# Patient Record
Sex: Female | Born: 1950 | Hispanic: Yes | Marital: Married | Smoking: Never smoker
Health system: Southern US, Community
[De-identification: ages and names within clinical notes are randomized; demographics above are authoritative.]

## PROBLEM LIST (undated history)

## (undated) DIAGNOSIS — K219 Gastro-esophageal reflux disease without esophagitis: Secondary | ICD-10-CM

## (undated) DIAGNOSIS — E785 Hyperlipidemia, unspecified: Secondary | ICD-10-CM

## (undated) DIAGNOSIS — Z8719 Personal history of other diseases of the digestive system: Secondary | ICD-10-CM

## (undated) DIAGNOSIS — K589 Irritable bowel syndrome without diarrhea: Secondary | ICD-10-CM

## (undated) DIAGNOSIS — C801 Malignant (primary) neoplasm, unspecified: Secondary | ICD-10-CM

## (undated) HISTORY — DX: Gastro-esophageal reflux disease without esophagitis: K21.9

## (undated) HISTORY — DX: Irritable bowel syndrome, unspecified: K58.9

## (undated) HISTORY — DX: Hyperlipidemia, unspecified: E78.5

## (undated) HISTORY — PX: COLONOSCOPY: SHX174

## (undated) HISTORY — PX: BREAST BIOPSY: SHX20

---

## 2015-01-04 ENCOUNTER — Other Ambulatory Visit: Payer: Self-pay | Admitting: Family Medicine

## 2015-01-04 DIAGNOSIS — Z1231 Encounter for screening mammogram for malignant neoplasm of breast: Secondary | ICD-10-CM

## 2015-01-12 ENCOUNTER — Ambulatory Visit
Admission: RE | Admit: 2015-01-12 | Discharge: 2015-01-12 | Disposition: A | Discharge status: Home / Self Care | Payer: 59 | Source: Ambulatory Visit | Attending: Family Medicine | Admitting: Family Medicine

## 2015-01-12 DIAGNOSIS — Z1231 Encounter for screening mammogram for malignant neoplasm of breast: Secondary | ICD-10-CM

## 2015-12-16 ENCOUNTER — Other Ambulatory Visit: Payer: Self-pay | Admitting: Family Medicine

## 2015-12-16 DIAGNOSIS — Z1231 Encounter for screening mammogram for malignant neoplasm of breast: Secondary | ICD-10-CM

## 2016-01-13 ENCOUNTER — Ambulatory Visit
Admission: RE | Admit: 2016-01-13 | Discharge: 2016-01-13 | Disposition: A | Discharge status: Home / Self Care | Payer: BLUE CROSS/BLUE SHIELD | Source: Ambulatory Visit | Attending: Family Medicine | Admitting: Family Medicine

## 2016-01-13 DIAGNOSIS — Z1231 Encounter for screening mammogram for malignant neoplasm of breast: Secondary | ICD-10-CM

## 2017-02-21 ENCOUNTER — Other Ambulatory Visit: Payer: Self-pay | Admitting: Physician Assistant

## 2017-02-21 DIAGNOSIS — Z1231 Encounter for screening mammogram for malignant neoplasm of breast: Secondary | ICD-10-CM

## 2017-03-12 ENCOUNTER — Encounter: Payer: Self-pay | Admitting: Radiology

## 2017-03-12 ENCOUNTER — Ambulatory Visit
Admission: RE | Admit: 2017-03-12 | Discharge: 2017-03-12 | Disposition: A | Discharge status: Home / Self Care | Payer: BLUE CROSS/BLUE SHIELD | Source: Ambulatory Visit | Attending: Physician Assistant | Admitting: Physician Assistant

## 2017-03-12 DIAGNOSIS — Z1231 Encounter for screening mammogram for malignant neoplasm of breast: Secondary | ICD-10-CM

## 2018-02-18 DIAGNOSIS — R1033 Periumbilical pain: Secondary | ICD-10-CM | POA: Diagnosis not present

## 2018-02-18 DIAGNOSIS — Z136 Encounter for screening for cardiovascular disorders: Secondary | ICD-10-CM | POA: Diagnosis not present

## 2018-02-18 DIAGNOSIS — Z0000 Encounter for general adult medical examination without abnormal findings: Secondary | ICD-10-CM | POA: Diagnosis not present

## 2019-08-26 ENCOUNTER — Other Ambulatory Visit: Payer: Self-pay | Admitting: Physician Assistant

## 2019-08-26 DIAGNOSIS — Z1231 Encounter for screening mammogram for malignant neoplasm of breast: Secondary | ICD-10-CM

## 2019-09-01 DIAGNOSIS — Z0000 Encounter for general adult medical examination without abnormal findings: Secondary | ICD-10-CM | POA: Diagnosis not present

## 2019-09-01 DIAGNOSIS — Z131 Encounter for screening for diabetes mellitus: Secondary | ICD-10-CM | POA: Diagnosis not present

## 2019-09-01 DIAGNOSIS — Z136 Encounter for screening for cardiovascular disorders: Secondary | ICD-10-CM | POA: Diagnosis not present

## 2019-09-03 ENCOUNTER — Other Ambulatory Visit: Payer: Self-pay

## 2019-09-03 ENCOUNTER — Ambulatory Visit
Admission: RE | Admit: 2019-09-03 | Discharge: 2019-09-03 | Disposition: A | Discharge status: Home / Self Care | Payer: Medicare HMO | Source: Ambulatory Visit | Attending: Physician Assistant | Admitting: Physician Assistant

## 2019-09-03 DIAGNOSIS — Z1231 Encounter for screening mammogram for malignant neoplasm of breast: Secondary | ICD-10-CM

## 2019-09-04 ENCOUNTER — Other Ambulatory Visit: Payer: Self-pay | Admitting: Physician Assistant

## 2019-09-04 DIAGNOSIS — N63 Unspecified lump in unspecified breast: Secondary | ICD-10-CM

## 2019-09-04 DIAGNOSIS — N644 Mastodynia: Secondary | ICD-10-CM

## 2019-09-10 ENCOUNTER — Ambulatory Visit: Payer: Medicare HMO

## 2019-09-10 ENCOUNTER — Ambulatory Visit
Admission: RE | Admit: 2019-09-10 | Discharge: 2019-09-10 | Disposition: A | Discharge status: Home / Self Care | Payer: Medicare HMO | Source: Ambulatory Visit | Attending: Physician Assistant | Admitting: Physician Assistant

## 2019-09-10 ENCOUNTER — Other Ambulatory Visit: Payer: Self-pay

## 2019-09-10 ENCOUNTER — Other Ambulatory Visit: Payer: Self-pay | Admitting: Physician Assistant

## 2019-09-10 DIAGNOSIS — N644 Mastodynia: Secondary | ICD-10-CM

## 2019-09-10 DIAGNOSIS — N63 Unspecified lump in unspecified breast: Secondary | ICD-10-CM

## 2019-09-10 DIAGNOSIS — R928 Other abnormal and inconclusive findings on diagnostic imaging of breast: Secondary | ICD-10-CM | POA: Diagnosis not present

## 2019-09-23 DIAGNOSIS — Z0100 Encounter for examination of eyes and vision without abnormal findings: Secondary | ICD-10-CM | POA: Diagnosis not present

## 2019-09-23 DIAGNOSIS — H2513 Age-related nuclear cataract, bilateral: Secondary | ICD-10-CM | POA: Diagnosis not present

## 2019-09-23 DIAGNOSIS — D3132 Benign neoplasm of left choroid: Secondary | ICD-10-CM | POA: Diagnosis not present

## 2019-09-24 DIAGNOSIS — Z833 Family history of diabetes mellitus: Secondary | ICD-10-CM | POA: Diagnosis not present

## 2019-09-24 DIAGNOSIS — R030 Elevated blood-pressure reading, without diagnosis of hypertension: Secondary | ICD-10-CM | POA: Diagnosis not present

## 2019-09-24 DIAGNOSIS — Z008 Encounter for other general examination: Secondary | ICD-10-CM | POA: Diagnosis not present

## 2019-09-24 DIAGNOSIS — E663 Overweight: Secondary | ICD-10-CM | POA: Diagnosis not present

## 2019-09-24 DIAGNOSIS — Z6829 Body mass index (BMI) 29.0-29.9, adult: Secondary | ICD-10-CM | POA: Diagnosis not present

## 2020-02-29 DIAGNOSIS — R69 Illness, unspecified: Secondary | ICD-10-CM | POA: Diagnosis not present

## 2020-04-21 DIAGNOSIS — R69 Illness, unspecified: Secondary | ICD-10-CM | POA: Diagnosis not present

## 2020-05-12 DIAGNOSIS — D1801 Hemangioma of skin and subcutaneous tissue: Secondary | ICD-10-CM | POA: Diagnosis not present

## 2020-05-12 DIAGNOSIS — B354 Tinea corporis: Secondary | ICD-10-CM | POA: Diagnosis not present

## 2020-06-16 DIAGNOSIS — J069 Acute upper respiratory infection, unspecified: Secondary | ICD-10-CM | POA: Diagnosis not present

## 2020-06-17 DIAGNOSIS — J069 Acute upper respiratory infection, unspecified: Secondary | ICD-10-CM | POA: Diagnosis not present

## 2020-07-19 DIAGNOSIS — U071 COVID-19: Secondary | ICD-10-CM | POA: Diagnosis not present

## 2020-07-19 DIAGNOSIS — J40 Bronchitis, not specified as acute or chronic: Secondary | ICD-10-CM | POA: Diagnosis not present

## 2020-09-01 DIAGNOSIS — R109 Unspecified abdominal pain: Secondary | ICD-10-CM | POA: Diagnosis not present

## 2020-09-01 DIAGNOSIS — Z136 Encounter for screening for cardiovascular disorders: Secondary | ICD-10-CM | POA: Diagnosis not present

## 2020-09-01 DIAGNOSIS — Z0000 Encounter for general adult medical examination without abnormal findings: Secondary | ICD-10-CM | POA: Diagnosis not present

## 2020-09-01 DIAGNOSIS — Z131 Encounter for screening for diabetes mellitus: Secondary | ICD-10-CM | POA: Diagnosis not present

## 2020-09-08 ENCOUNTER — Other Ambulatory Visit: Payer: Self-pay | Admitting: Nurse Practitioner

## 2020-09-08 DIAGNOSIS — Z1231 Encounter for screening mammogram for malignant neoplasm of breast: Secondary | ICD-10-CM

## 2020-09-16 DIAGNOSIS — R1013 Epigastric pain: Secondary | ICD-10-CM | POA: Diagnosis not present

## 2020-09-16 DIAGNOSIS — Z1211 Encounter for screening for malignant neoplasm of colon: Secondary | ICD-10-CM | POA: Diagnosis not present

## 2020-09-19 DIAGNOSIS — L2084 Intrinsic (allergic) eczema: Secondary | ICD-10-CM | POA: Diagnosis not present

## 2020-10-27 ENCOUNTER — Other Ambulatory Visit: Payer: Self-pay

## 2020-10-27 ENCOUNTER — Ambulatory Visit
Admission: RE | Admit: 2020-10-27 | Discharge: 2020-10-27 | Disposition: A | Discharge status: Home / Self Care | Payer: Medicare HMO | Source: Ambulatory Visit | Attending: Nurse Practitioner | Admitting: Nurse Practitioner

## 2020-10-27 DIAGNOSIS — Z1231 Encounter for screening mammogram for malignant neoplasm of breast: Secondary | ICD-10-CM

## 2020-11-17 DIAGNOSIS — R1013 Epigastric pain: Secondary | ICD-10-CM | POA: Diagnosis not present

## 2020-11-17 DIAGNOSIS — K219 Gastro-esophageal reflux disease without esophagitis: Secondary | ICD-10-CM | POA: Diagnosis not present

## 2020-11-24 DIAGNOSIS — R1013 Epigastric pain: Secondary | ICD-10-CM | POA: Diagnosis not present

## 2021-02-10 DIAGNOSIS — L601 Onycholysis: Secondary | ICD-10-CM | POA: Diagnosis not present

## 2021-03-08 DIAGNOSIS — R030 Elevated blood-pressure reading, without diagnosis of hypertension: Secondary | ICD-10-CM | POA: Diagnosis not present

## 2021-03-08 DIAGNOSIS — Z823 Family history of stroke: Secondary | ICD-10-CM | POA: Diagnosis not present

## 2021-03-08 DIAGNOSIS — K219 Gastro-esophageal reflux disease without esophagitis: Secondary | ICD-10-CM | POA: Diagnosis not present

## 2021-03-08 DIAGNOSIS — M1990 Unspecified osteoarthritis, unspecified site: Secondary | ICD-10-CM | POA: Diagnosis not present

## 2021-03-08 DIAGNOSIS — Z6827 Body mass index (BMI) 27.0-27.9, adult: Secondary | ICD-10-CM | POA: Diagnosis not present

## 2021-03-08 DIAGNOSIS — E663 Overweight: Secondary | ICD-10-CM | POA: Diagnosis not present

## 2021-03-08 DIAGNOSIS — R32 Unspecified urinary incontinence: Secondary | ICD-10-CM | POA: Diagnosis not present

## 2021-03-08 DIAGNOSIS — Z833 Family history of diabetes mellitus: Secondary | ICD-10-CM | POA: Diagnosis not present

## 2021-06-14 DIAGNOSIS — J060 Acute laryngopharyngitis: Secondary | ICD-10-CM | POA: Diagnosis not present

## 2021-08-07 DIAGNOSIS — A048 Other specified bacterial intestinal infections: Secondary | ICD-10-CM | POA: Diagnosis not present

## 2021-08-07 DIAGNOSIS — R109 Unspecified abdominal pain: Secondary | ICD-10-CM | POA: Diagnosis not present

## 2021-08-21 ENCOUNTER — Other Ambulatory Visit: Payer: Self-pay | Admitting: Gastroenterology

## 2021-08-21 DIAGNOSIS — R109 Unspecified abdominal pain: Secondary | ICD-10-CM

## 2021-08-23 ENCOUNTER — Ambulatory Visit
Admission: RE | Admit: 2021-08-23 | Discharge: 2021-08-23 | Disposition: A | Discharge status: Home / Self Care | Payer: Medicare HMO | Source: Ambulatory Visit | Attending: Gastroenterology | Admitting: Gastroenterology

## 2021-08-23 DIAGNOSIS — R109 Unspecified abdominal pain: Secondary | ICD-10-CM

## 2021-08-23 DIAGNOSIS — A048 Other specified bacterial intestinal infections: Secondary | ICD-10-CM | POA: Diagnosis not present

## 2021-08-23 DIAGNOSIS — K8020 Calculus of gallbladder without cholecystitis without obstruction: Secondary | ICD-10-CM | POA: Diagnosis not present

## 2021-08-23 DIAGNOSIS — K760 Fatty (change of) liver, not elsewhere classified: Secondary | ICD-10-CM | POA: Diagnosis not present

## 2021-09-19 ENCOUNTER — Other Ambulatory Visit: Payer: Self-pay | Admitting: Nurse Practitioner

## 2021-09-19 DIAGNOSIS — Z1231 Encounter for screening mammogram for malignant neoplasm of breast: Secondary | ICD-10-CM

## 2021-09-20 DIAGNOSIS — K589 Irritable bowel syndrome without diarrhea: Secondary | ICD-10-CM | POA: Diagnosis not present

## 2021-09-20 DIAGNOSIS — Z13820 Encounter for screening for osteoporosis: Secondary | ICD-10-CM | POA: Diagnosis not present

## 2021-09-20 DIAGNOSIS — E785 Hyperlipidemia, unspecified: Secondary | ICD-10-CM | POA: Diagnosis not present

## 2021-09-20 DIAGNOSIS — Z7185 Encounter for immunization safety counseling: Secondary | ICD-10-CM | POA: Diagnosis not present

## 2021-09-20 DIAGNOSIS — Z0000 Encounter for general adult medical examination without abnormal findings: Secondary | ICD-10-CM | POA: Diagnosis not present

## 2021-09-20 DIAGNOSIS — Z1211 Encounter for screening for malignant neoplasm of colon: Secondary | ICD-10-CM | POA: Diagnosis not present

## 2021-09-20 DIAGNOSIS — K8020 Calculus of gallbladder without cholecystitis without obstruction: Secondary | ICD-10-CM | POA: Diagnosis not present

## 2021-09-22 ENCOUNTER — Other Ambulatory Visit: Payer: Self-pay | Admitting: Family Medicine

## 2021-09-22 DIAGNOSIS — Z13820 Encounter for screening for osteoporosis: Secondary | ICD-10-CM

## 2021-10-19 DIAGNOSIS — K219 Gastro-esophageal reflux disease without esophagitis: Secondary | ICD-10-CM | POA: Diagnosis not present

## 2021-10-19 DIAGNOSIS — K589 Irritable bowel syndrome without diarrhea: Secondary | ICD-10-CM | POA: Diagnosis not present

## 2021-10-19 DIAGNOSIS — Z833 Family history of diabetes mellitus: Secondary | ICD-10-CM | POA: Diagnosis not present

## 2021-10-30 ENCOUNTER — Ambulatory Visit
Admission: RE | Admit: 2021-10-30 | Discharge: 2021-10-30 | Disposition: A | Discharge status: Home / Self Care | Payer: Medicare HMO | Source: Ambulatory Visit | Attending: Nurse Practitioner | Admitting: Nurse Practitioner

## 2021-10-30 DIAGNOSIS — Z1231 Encounter for screening mammogram for malignant neoplasm of breast: Secondary | ICD-10-CM

## 2021-11-02 ENCOUNTER — Other Ambulatory Visit: Payer: Self-pay | Admitting: Nurse Practitioner

## 2021-11-02 DIAGNOSIS — N6452 Nipple discharge: Secondary | ICD-10-CM

## 2021-12-21 ENCOUNTER — Other Ambulatory Visit: Payer: Self-pay | Admitting: Nurse Practitioner

## 2021-12-21 ENCOUNTER — Ambulatory Visit
Admission: RE | Admit: 2021-12-21 | Discharge: 2021-12-21 | Disposition: A | Discharge status: Home / Self Care | Payer: Medicare HMO | Source: Ambulatory Visit | Attending: Nurse Practitioner | Admitting: Nurse Practitioner

## 2021-12-21 DIAGNOSIS — R921 Mammographic calcification found on diagnostic imaging of breast: Secondary | ICD-10-CM | POA: Diagnosis not present

## 2021-12-21 DIAGNOSIS — N6452 Nipple discharge: Secondary | ICD-10-CM

## 2021-12-28 ENCOUNTER — Ambulatory Visit
Admission: RE | Admit: 2021-12-28 | Discharge: 2021-12-28 | Disposition: A | Discharge status: Home / Self Care | Payer: Medicare HMO | Source: Ambulatory Visit | Attending: Nurse Practitioner | Admitting: Nurse Practitioner

## 2021-12-28 ENCOUNTER — Other Ambulatory Visit: Payer: Self-pay | Admitting: Family Medicine

## 2021-12-28 DIAGNOSIS — R921 Mammographic calcification found on diagnostic imaging of breast: Secondary | ICD-10-CM

## 2021-12-28 DIAGNOSIS — D241 Benign neoplasm of right breast: Secondary | ICD-10-CM | POA: Diagnosis not present

## 2022-01-16 DIAGNOSIS — N6452 Nipple discharge: Secondary | ICD-10-CM | POA: Diagnosis not present

## 2022-01-30 ENCOUNTER — Other Ambulatory Visit: Payer: Self-pay | Admitting: Surgery

## 2022-01-30 DIAGNOSIS — N6452 Nipple discharge: Secondary | ICD-10-CM

## 2022-02-14 ENCOUNTER — Ambulatory Visit
Admission: RE | Admit: 2022-02-14 | Discharge: 2022-02-14 | Disposition: A | Discharge status: Home / Self Care | Payer: Medicare HMO | Source: Ambulatory Visit | Attending: Surgery | Admitting: Surgery

## 2022-02-14 DIAGNOSIS — N6452 Nipple discharge: Secondary | ICD-10-CM

## 2022-02-14 DIAGNOSIS — N6489 Other specified disorders of breast: Secondary | ICD-10-CM | POA: Diagnosis not present

## 2022-02-14 MED ORDER — GADOBUTROL 1 MMOL/ML IV SOLN
7.0000 mL | Freq: Once | INTRAVENOUS | Status: AC | PRN
  Administered 2022-02-14: 7 mL via INTRAVENOUS

## 2022-02-19 DIAGNOSIS — N6452 Nipple discharge: Secondary | ICD-10-CM | POA: Diagnosis not present

## 2022-03-27 ENCOUNTER — Other Ambulatory Visit: Payer: Medicare HMO

## 2022-07-11 DIAGNOSIS — G479 Sleep disorder, unspecified: Secondary | ICD-10-CM | POA: Diagnosis not present

## 2022-09-11 ENCOUNTER — Other Ambulatory Visit: Payer: Medicare HMO

## 2022-09-20 DIAGNOSIS — H33311 Horseshoe tear of retina without detachment, right eye: Secondary | ICD-10-CM | POA: Diagnosis not present

## 2022-09-20 DIAGNOSIS — H43811 Vitreous degeneration, right eye: Secondary | ICD-10-CM | POA: Diagnosis not present

## 2022-09-20 DIAGNOSIS — H43822 Vitreomacular adhesion, left eye: Secondary | ICD-10-CM | POA: Diagnosis not present

## 2022-09-20 DIAGNOSIS — D3131 Benign neoplasm of right choroid: Secondary | ICD-10-CM | POA: Diagnosis not present

## 2022-09-20 DIAGNOSIS — D3132 Benign neoplasm of left choroid: Secondary | ICD-10-CM | POA: Diagnosis not present

## 2022-09-26 DIAGNOSIS — K589 Irritable bowel syndrome without diarrhea: Secondary | ICD-10-CM | POA: Diagnosis not present

## 2022-09-26 DIAGNOSIS — Z1211 Encounter for screening for malignant neoplasm of colon: Secondary | ICD-10-CM | POA: Diagnosis not present

## 2022-09-26 DIAGNOSIS — Z13820 Encounter for screening for osteoporosis: Secondary | ICD-10-CM | POA: Diagnosis not present

## 2022-09-26 DIAGNOSIS — Z23 Encounter for immunization: Secondary | ICD-10-CM | POA: Diagnosis not present

## 2022-09-26 DIAGNOSIS — K219 Gastro-esophageal reflux disease without esophagitis: Secondary | ICD-10-CM | POA: Diagnosis not present

## 2022-09-26 DIAGNOSIS — E785 Hyperlipidemia, unspecified: Secondary | ICD-10-CM | POA: Diagnosis not present

## 2022-09-26 DIAGNOSIS — Z7185 Encounter for immunization safety counseling: Secondary | ICD-10-CM | POA: Diagnosis not present

## 2022-09-26 DIAGNOSIS — Z13220 Encounter for screening for lipoid disorders: Secondary | ICD-10-CM | POA: Diagnosis not present

## 2022-09-26 DIAGNOSIS — K8020 Calculus of gallbladder without cholecystitis without obstruction: Secondary | ICD-10-CM | POA: Diagnosis not present

## 2022-09-26 DIAGNOSIS — Z1231 Encounter for screening mammogram for malignant neoplasm of breast: Secondary | ICD-10-CM | POA: Diagnosis not present

## 2022-09-26 DIAGNOSIS — Z0000 Encounter for general adult medical examination without abnormal findings: Secondary | ICD-10-CM | POA: Diagnosis not present

## 2022-09-27 DIAGNOSIS — R7309 Other abnormal glucose: Secondary | ICD-10-CM | POA: Diagnosis not present

## 2022-10-02 ENCOUNTER — Other Ambulatory Visit: Payer: Self-pay | Admitting: Family Medicine

## 2022-10-02 DIAGNOSIS — Z13820 Encounter for screening for osteoporosis: Secondary | ICD-10-CM

## 2022-10-04 DIAGNOSIS — H43811 Vitreous degeneration, right eye: Secondary | ICD-10-CM | POA: Diagnosis not present

## 2022-10-04 DIAGNOSIS — D3131 Benign neoplasm of right choroid: Secondary | ICD-10-CM | POA: Diagnosis not present

## 2022-10-04 DIAGNOSIS — H31091 Other chorioretinal scars, right eye: Secondary | ICD-10-CM | POA: Diagnosis not present

## 2022-10-04 DIAGNOSIS — H43822 Vitreomacular adhesion, left eye: Secondary | ICD-10-CM | POA: Diagnosis not present

## 2022-11-09 DIAGNOSIS — R319 Hematuria, unspecified: Secondary | ICD-10-CM | POA: Diagnosis not present

## 2022-11-13 ENCOUNTER — Other Ambulatory Visit: Payer: Self-pay | Admitting: Family Medicine

## 2022-11-13 DIAGNOSIS — R319 Hematuria, unspecified: Secondary | ICD-10-CM

## 2022-11-26 ENCOUNTER — Ambulatory Visit
Admission: RE | Admit: 2022-11-26 | Discharge: 2022-11-26 | Disposition: A | Discharge status: Home / Self Care | Payer: HMO | Source: Ambulatory Visit | Attending: Family Medicine | Admitting: Family Medicine

## 2022-11-26 DIAGNOSIS — R310 Gross hematuria: Secondary | ICD-10-CM | POA: Diagnosis not present

## 2022-11-26 DIAGNOSIS — R109 Unspecified abdominal pain: Secondary | ICD-10-CM | POA: Diagnosis not present

## 2022-11-26 DIAGNOSIS — R319 Hematuria, unspecified: Secondary | ICD-10-CM

## 2022-11-26 DIAGNOSIS — K8020 Calculus of gallbladder without cholecystitis without obstruction: Secondary | ICD-10-CM | POA: Diagnosis not present

## 2022-11-26 MED ORDER — IOPAMIDOL (ISOVUE-300) INJECTION 61%
100.0000 mL | Freq: Once | INTRAVENOUS | Status: AC | PRN
  Administered 2022-11-26: 100 mL via INTRAVENOUS

## 2022-12-05 DIAGNOSIS — H31091 Other chorioretinal scars, right eye: Secondary | ICD-10-CM | POA: Diagnosis not present

## 2022-12-05 DIAGNOSIS — H43822 Vitreomacular adhesion, left eye: Secondary | ICD-10-CM | POA: Diagnosis not present

## 2022-12-05 DIAGNOSIS — D3131 Benign neoplasm of right choroid: Secondary | ICD-10-CM | POA: Diagnosis not present

## 2022-12-05 DIAGNOSIS — H43811 Vitreous degeneration, right eye: Secondary | ICD-10-CM | POA: Diagnosis not present

## 2022-12-10 ENCOUNTER — Other Ambulatory Visit: Payer: Self-pay | Admitting: Family Medicine

## 2022-12-10 DIAGNOSIS — D259 Leiomyoma of uterus, unspecified: Secondary | ICD-10-CM

## 2022-12-14 ENCOUNTER — Ambulatory Visit
Admission: RE | Admit: 2022-12-14 | Discharge: 2022-12-14 | Disposition: A | Discharge status: Home / Self Care | Payer: HMO | Source: Ambulatory Visit | Attending: Family Medicine | Admitting: Family Medicine

## 2022-12-14 DIAGNOSIS — R9389 Abnormal findings on diagnostic imaging of other specified body structures: Secondary | ICD-10-CM | POA: Diagnosis not present

## 2022-12-14 DIAGNOSIS — D259 Leiomyoma of uterus, unspecified: Secondary | ICD-10-CM

## 2022-12-19 DIAGNOSIS — R310 Gross hematuria: Secondary | ICD-10-CM | POA: Diagnosis not present

## 2022-12-19 DIAGNOSIS — R399 Unspecified symptoms and signs involving the genitourinary system: Secondary | ICD-10-CM | POA: Diagnosis not present

## 2022-12-20 NOTE — Progress Notes (Deleted)
Name: Carrie Newman DOB: 1950/10/30 MRN: [id number]  History of Present Illness: Ms. Carrie Newman is a 72 y.o. female who presents today as a new patient at [facility].  She reports chief complaint of gross hematuria.  Per scanned records:  - 11/09/2022: Negative urine culture. - 11/26/2022: CT abdomen/pelvis w/wo contrast showed no GU stones, masses, or hydronephrosis - no findings to explain gross hematuria. - 12/19/2022: Per PCP note patient has had intermittent gross hematuria since 11/02/2022. UA positive for large blood, small leukocytes, no nitrites.   ***needs voided cytology & cystoscopy  Today: She reports ***persistent ***intermittent gross hematuria.   She {Actions; denies-reports:120008} urinary urgency, frequency, dysuria, straining to void, or sensations of incomplete emptying. She {Actions; denies-reports:120008} ***abdominal or ***flank pain.   She {Actions; denies-reports:120008} history of kidney stones.  She {Actions; denies-reports:120008} history of pyelonephritis.  She {Actions; denies-reports:120008} history of recent or recurrent UTI. She {Actions; denies-reports:120008} history of GU malignancy or pelvic radiation.  She {Actions; denies-reports:120008} history of autoimmune disease. She denies history of smoking. She {Actions; denies-reports:120008} known occupational risks. She {Actions; denies-reports:120008} taking anticoagulants (***).   Fall Screening: Do you usually have a device to assist in your mobility? {yes/no:20286} ***cane / ***walker / ***wheelchair  Medications: No current outpatient medications on file.   No current facility-administered medications for this visit.    Allergies: Not on File  No past medical history on file. Past Surgical History:  Procedure Laterality Date   BREAST BIOPSY Right    Family History  Problem Relation Age of Onset   Breast cancer Neg Hx    Social History   Socioeconomic History    Marital status: Married    Spouse name: Not on file   Number of children: Not on file   Years of education: Not on file   Highest education level: Not on file  Occupational History   Not on file  Tobacco Use   Smoking status: Not on file   Smokeless tobacco: Not on file  Substance and Sexual Activity   Alcohol use: Not on file   Drug use: Not on file   Sexual activity: Not on file  Other Topics Concern   Not on file  Social History Narrative   Not on file   Social Determinants of Health   Financial Resource Strain: Not on file  Food Insecurity: Not on file  Transportation Needs: Not on file  Physical Activity: Not on file  Stress: Not on file  Social Connections: Not on file  Intimate Partner Violence: Not on file    SUBJECTIVE  Review of Systems Constitutional: Patient ***denies any unintentional weight loss or change in strength lntegumentary: Patient ***denies any rashes or pruritus Eyes: Patient denies ***dry eyes ENT: Patient ***denies dry mouth Cardiovascular: Patient ***denies chest pain or syncope Respiratory: Patient ***denies shortness of breath Gastrointestinal: Patient ***denies nausea, vomiting, constipation, or diarrhea Musculoskeletal: Patient ***denies muscle cramps or weakness Neurologic: Patient ***denies convulsions or seizures Psychiatric: Patient ***denies memory problems Allergic/Immunologic: Patient ***denies recent allergic reaction(s) Hematologic/Lymphatic: Patient denies bleeding tendencies Endocrine: Patient ***denies heat/cold intolerance  GU: As per HPI.  OBJECTIVE There were no vitals filed for this visit. There is no height or weight on file to calculate BMI.  Physical Examination  Constitutional: ***No obvious distress; patient is ***non-toxic appearing  Cardiovascular: ***No visible lower extremity edema.  Respiratory: The patient does ***not have audible wheezing/stridor; respirations do ***not appear labored   Gastrointestinal: Abdomen ***non-distended Musculoskeletal: ***Normal ROM of UEs  Skin: ***No obvious rashes/open sores  Neurologic: CN 2-12 grossly ***intact Psychiatric: Answered questions ***appropriately with ***normal affect  Hematologic/Lymphatic/Immunologic: ***No obvious bruises or sites of spontaneous bleeding  UA: {Desc; negative/positive:13464} for *** WBC/hpf, *** RBC/hpf, bacteria (***) PVR: *** ml  ASSESSMENT No diagnosis found.  For management of gross hematuria, we discussed possible etiologies including but not limited to: vigorous exercise, sexual activity, stone, trauma, blood thinner use, urinary tract infection, kidney function, malignancy.   We discussed the importance of further evaluation with ***cystoscopy and ***voided cytology.  We discussed the risk for clot retention and pt was advised to increase fluid intake to thin out clots. Pt was advised to go to the ER if they become unable to urinate due to clot retention, start having symptoms of anemia (which were discussed), or any other significant concerning acute symptoms.  Pt verbalized understanding and decided to pursue this work-up. Patient agreed to follow-up afterward to discuss the results and formulate a treatment plan based on the findings. All questions were answered.   PLAN Advised the following: Voided cytology ***ordered. 4. ***No follow-ups on file.  No orders of the defined types were placed in this encounter.   It has been explained that the patient is to follow regularly with their PCP in addition to all other providers involved in their care and to follow instructions provided by these respective offices. Patient advised to contact urology clinic if any urologic-pertaining questions, concerns, new symptoms or problems arise in the interim period.  There are no Patient Instructions on file for this visit.  Electronically signed by:  Donnita Falls, MSN, FNP-C, CUNP 12/20/2022 2:50 PM

## 2022-12-25 ENCOUNTER — Ambulatory Visit: Payer: HMO | Admitting: Urology

## 2022-12-25 DIAGNOSIS — R310 Gross hematuria: Secondary | ICD-10-CM

## 2022-12-26 ENCOUNTER — Encounter: Payer: Self-pay | Admitting: Urology

## 2022-12-26 ENCOUNTER — Ambulatory Visit (INDEPENDENT_AMBULATORY_CARE_PROVIDER_SITE_OTHER): Payer: HMO | Admitting: Urology

## 2022-12-26 VITALS — BP 144/92 | HR 80 | Ht 62.0 in | Wt 143.0 lb

## 2022-12-26 DIAGNOSIS — N939 Abnormal uterine and vaginal bleeding, unspecified: Secondary | ICD-10-CM | POA: Diagnosis not present

## 2022-12-26 DIAGNOSIS — R310 Gross hematuria: Secondary | ICD-10-CM | POA: Diagnosis not present

## 2022-12-26 NOTE — Progress Notes (Signed)
Assessment: 1. Gross hematuria   2. Vaginal bleeding     Plan: I personally reviewed the patient's chart including provider notes, lab and imaging results. I personally reviewed the CT study from 11/26/2022 with results as noted below. I discussed the normal cystoscopy findings with the patient and her husband today. Urine cytology sent today. Cipro x 1 following cystoscopy. Recommend further evaluation by OB/GYN for apparent vaginal bleeding and abnormal pelvic ultrasound showing thickened endometrium. Given these findings, I am concerned that her bleeding is actually vaginal in origin. Return to office in 6 weeks.  Per the patient's request, I discussed the findings and recommendations with her daughter as well.  Chief Complaint:  Chief Complaint  Patient presents with   Hematuria    History of Present Illness:  Carrie Newman is a 72 y.o. female who is seen in consultation from Sharlette Dense, [place] for evaluation of gross hematuria.  She reports intermittent episodes of gross hematuria since May 2024.  She has noted a small amount of blood in her urine at times.  She had increased hematuria last week and presented to urgent care. She has not seen any visible blood in the past 24 hours. Urinalysis from 6/24 showed >20 RBCs, >20 WBCs.  Urine culture grew 10-20 5K multiple species. CT abdomen and pelvis with and without contrast from 11/26/2022 showed no renal or ureteral calculi, no hydronephrosis, no filling defects.  A 3 cm fundal lesion was noted. Pelvic ultrasound from 12/14/2022 showed marked abnormal thickening of the endometrium highly suspicious for malignancy. No dysuria or flank pain.  She does complain of some mid abdominal pain with radiation to the pelvic area. No history of kidney stones or UTIs. She has symptoms of incontinence and nocturia.  She uses pads for management of her urinary incontinence.  Past Medical History:  Past Medical History:  Diagnosis Date   GERD  (gastroesophageal reflux disease)    Hyperlipidemia    IBS (irritable bowel syndrome)     Past Surgical History:  Past Surgical History:  Procedure Laterality Date   BREAST BIOPSY Right    CESAREAN SECTION      Allergies:  No Known Allergies  Family History:  Family History  Problem Relation Age of Onset   Breast cancer Neg Hx     Social History:  Social History   Tobacco Use   Smoking status: Never   Smokeless tobacco: Never  Substance Use Topics   Alcohol use: Yes    Review of symptoms:  Constitutional:  Negative for unexplained weight loss, night sweats, fever, chills ENT:  Negative for nose bleeds, sinus pain, painful swallowing CV:  Negative for chest pain, shortness of breath, exercise intolerance, palpitations, loss of consciousness Resp:  Negative for cough, wheezing, shortness of breath GI:  Negative for nausea, vomiting, diarrhea, bloody stools GU:  Positives noted in HPI; otherwise negative for dysuria Neuro:  Negative for seizures, poor balance, limb weakness, slurred speech Psych:  Negative for lack of energy, depression, anxiety Endocrine:  Negative for polydipsia, polyuria, symptoms of hypoglycemia (dizziness, hunger, sweating) Hematologic:  Negative for anemia, purpura, petechia, prolonged or excessive bleeding, use of anticoagulants  Allergic:  Negative for difficulty breathing or choking as a result of exposure to anything; no shellfish allergy; no allergic response (rash/itch) to materials, foods  Physical exam: BP (!) 144/92   Pulse 80   Ht 5\' 2"  (1.575 m)   Wt 143 lb (64.9 kg)   BMI 26.16 kg/m  GENERAL APPEARANCE:  Well appearing, well developed, well nourished, NAD HEENT: Atraumatic, Normocephalic, oropharynx clear. NECK: Supple without lymphadenopathy or thyromegaly. LUNGS: Clear to auscultation bilaterally. HEART: Regular Rate and Rhythm without murmurs, gallops, or rubs. ABDOMEN: Soft, non-tender, No Masses. EXTREMITIES: Moves all  extremities well.  Without clubbing, cyanosis, or edema. NEUROLOGIC:  Alert and oriented x 3, normal gait, CN II-XII grossly intact.  MENTAL STATUS:  Appropriate. BACK:  Non-tender to palpation.  No CVAT SKIN:  Warm, dry and intact.    Results: U/A: 6-10 WBC, 0-2 RBC  CYSTOSCOPY  Procedure: Flexible cystoscopy  Pre-Operative Diagnosis:  Gross hematuria  Post-Operative Diagnosis: Gross hematuria  Anesthesia: local with lidocaine gel  Surgical Narrative:  After appropriate informed consent was obtained, the patient was prepped and draped in the usual sterile fashion in the supine position. She was correctly identified and the proper procedure delineated prior to proceeding. Sterile lidocaine gel was instilled in the urethra.  The flexible cystoscope was introduced without difficulty.  Findings:  Urethra: Normal  Bladder: Normal  Ureteral orifices: normal  Additional findings: none  A bladder wash was obtained for cytology.  Pelvic exam was not positive for pelvic prolapse.  Blood noted in vaginal vault.  She tolerated the procedure well.  A chaperone was present throughout the procedure.

## 2022-12-27 MED ORDER — CIPROFLOXACIN HCL 500 MG PO TABS
500.0000 mg | ORAL_TABLET | Freq: Once | ORAL | Status: AC
Start: 2022-12-27 — End: 2022-12-27
  Administered 2022-12-27: 500 mg via ORAL

## 2022-12-27 NOTE — Addendum Note (Signed)
Addended by: Lizbeth Bark on: 12/27/2022 09:42 AM   Modules accepted: Orders

## 2022-12-31 ENCOUNTER — Encounter: Payer: Self-pay | Admitting: Obstetrics and Gynecology

## 2022-12-31 ENCOUNTER — Ambulatory Visit (INDEPENDENT_AMBULATORY_CARE_PROVIDER_SITE_OTHER): Payer: HMO | Admitting: Obstetrics and Gynecology

## 2022-12-31 ENCOUNTER — Other Ambulatory Visit (HOSPITAL_COMMUNITY)
Admission: RE | Admit: 2022-12-31 | Discharge: 2022-12-31 | Disposition: A | Discharge status: Home / Self Care | Payer: HMO | Source: Ambulatory Visit | Attending: Obstetrics and Gynecology | Admitting: Obstetrics and Gynecology

## 2022-12-31 VITALS — BP 150/91 | HR 74 | Wt 142.3 lb

## 2022-12-31 DIAGNOSIS — N950 Postmenopausal bleeding: Secondary | ICD-10-CM | POA: Insufficient documentation

## 2022-12-31 MED ORDER — MEGESTROL ACETATE 40 MG PO TABS: 40.0000 mg | ORAL_TABLET | Freq: Two times a day (BID) | ORAL | 5 refills | Status: DC

## 2022-12-31 NOTE — Progress Notes (Unsigned)
NEW GYNECOLOGY PATIENT Patient name: Murlee Huaman MRN [id number]  Date of birth: 1950-07-20 Chief Complaint:   Procedure     History:  Brithney Spinuzzi is a 72 y.o. No obstetric history on file. being seen today for PMB. Had bleeding in May, started off light and wne to urgenct care and hasn't stopped since the. Jhonnie Garner tto primary care and they got a cat scan and then ultraound Bleeding has tapered off and is very light. No medication just slowing down on its own  Had pain like a menses , last menses around 2005 (late 40s) No BM changes Loss of 8# in the last 3 mo unintentionally     Gynecologic History No LMP recorded. Patient is postmenopausal. Contraception: post menopausal status Last Pap: aged out Last Mammogram: 01/2022 BIRADS 1 Last Colonoscopy: none seen on file   Obstetric History OB History  No obstetric history on file.    Past Medical History:  Diagnosis Date   GERD (gastroesophageal reflux disease)    Hyperlipidemia    IBS (irritable bowel syndrome)     Past Surgical History:  Procedure Laterality Date   BREAST BIOPSY Right    CESAREAN SECTION      No current outpatient medications on file prior to visit.   No current facility-administered medications on file prior to visit.    No Known Allergies  Social History:  reports that she has never smoked. She has never used smokeless tobacco. She reports current alcohol use.  Family History  Problem Relation Age of Onset   Breast cancer Neg Hx     The following portions of the patient's history were reviewed and updated as appropriate: allergies, current medications, past family history, past medical history, past social history, past surgical history and problem list.  Review of Systems Pertinent items noted in HPI and remainder of comprehensive ROS otherwise negative.  Physical Exam:  BP (!) 157/89   Pulse 79   Wt 142 lb 4.8 oz (64.5 kg)   BMI 26.03 kg/m  Physical Exam Vitals and nursing note reviewed.  Exam conducted with a chaperone present.  Constitutional:      Appearance: Normal appearance.  Cardiovascular:     Rate and Rhythm: Normal rate.  Pulmonary:     Effort: Pulmonary effort is normal.     Breath sounds: Normal breath sounds.  Genitourinary:    General: Normal vulva.     Exam position: Lithotomy position.     Vagina: Normal.     Cervix: Normal.     Comments: Small, atrophic cervix without masses or lesions Neurological:     General: No focal deficit present.     Mental Status: She is alert and oriented to person, place, and time.  Psychiatric:        Mood and Affect: Mood normal.        Behavior: Behavior normal.        Thought Content: Thought content normal.        Judgment: Judgment normal.    Endometrial Biopsy Procedure  Patient identified, informed consent performed,  indication reviewed, consent signed.  Reviewed risk of perforation, pain, bleeding, insufficient sample, etc were reviewd. Time out was performed.  Urine pregnancy test negative.  Speculum placed in the vagina.  Cervix visualized.  Cleaned with Betadine x 2.  Anterior cervix was attempted to be  grasped anteriorly with a single tooth tenaculum though there was difficulty due to it's atrophic state.  Paracervical block was not administered. The pipelle was  not able to be entered into the cavity initially. There was an attempt at dilation with disposable dilators that was unsuccessful. A uterine sound was introduced into the cervix and with gentle pressure the endometrial cavity was entered. The dilator was used to dilate the cervix. Endometrial pipelle was used to draw up 1cc of 1% lidocaine, introduced into the cervical os and instilled into the endometrial cavity.  The pipelle was passed twice and sample obtained. The tenaculum was removed. There was continuous dark vaginal bleeding once sample obtained. Large cotton swabs used to evacuate the vaginal canal but the bleeding continued. At this time gauze was  placed in the vagina as packing and remained in place for ~8 minutes at which time it was removed and bleeding had slowed down. A large cotton swab was passed through the vaginal and there was little bleeding.   Patient tolerated procedure well.  Patient was given post-procedure instructions.      Assessment and Plan:   1. Postmenopausal bleeding Now s/p endometrial biopsy with significant bleeding after sample obtained, recommend starting megace to control bleeding for now. Will contact patient and daughter (patient gives permission to contact Byrd Hesselbach) regarding results. Conveyed high concern for malignancy given the bleeding and US findings.  - megestrol (MEGACE) 40 MG tablet; Take 1 tablet (40 mg total) by mouth 2 (two) times daily. Can increase to two tablets twice a day in the event of heavy bleeding  Dispense: 60 tablet; Refill: 5    Routine preventative health maintenance measures emphasized. Please refer to After Visit Summary for other counseling recommendations.   Follow-up: No follow-ups on file.      Lorriane Shire, MD Obstetrician & Gynecologist, [facility] [employer], [facility]

## 2023-01-01 ENCOUNTER — Encounter: Payer: Self-pay | Admitting: Obstetrics and Gynecology

## 2023-01-03 ENCOUNTER — Encounter: Payer: Self-pay | Admitting: Urology

## 2023-01-04 ENCOUNTER — Telehealth: Payer: Self-pay | Admitting: Obstetrics and Gynecology

## 2023-01-04 ENCOUNTER — Telehealth: Payer: Self-pay

## 2023-01-04 DIAGNOSIS — C541 Malignant neoplasm of endometrium: Secondary | ICD-10-CM

## 2023-01-04 NOTE — Telephone Encounter (Signed)
Spoke with the patient regarding the referral to GYN oncology. Patient scheduled as new patient with Dr Alvester Morin on 01/07/2023. Patient given an arrival time of 9:15am.  Explained to the patient the the doctor will perform a pelvic exam at this visit. Patient given the policy that only one visitor allowed and that visitor must be over 16 yrs are allowed in the Cancer Center. Patient given the address/phone number for the clinic and that the center offers free valet service. Patient aware that masks are option.

## 2023-01-04 NOTE — Telephone Encounter (Signed)
Called patient with daughter on the line as previously requested.  Pt ID confirmed x2 Reviewed pathology confirms endometrial cancer Referral placed to gyn onc, all questions answered as best as able Bleeding has slowed down since biopsy and doing ok   FINAL MICROSCOPIC DIAGNOSIS:   A. ENDOMETRIUM, BIOPSY:  Clear cell carcinoma.  See comment.

## 2023-01-07 ENCOUNTER — Encounter: Payer: Self-pay | Admitting: Psychiatry

## 2023-01-07 ENCOUNTER — Inpatient Hospital Stay: Payer: HMO | Attending: Psychiatry | Admitting: Psychiatry

## 2023-01-07 ENCOUNTER — Inpatient Hospital Stay (HOSPITAL_BASED_OUTPATIENT_CLINIC_OR_DEPARTMENT_OTHER): Payer: HMO | Admitting: Gynecologic Oncology

## 2023-01-07 VITALS — BP 147/77 | HR 84 | Temp 98.3°F | Resp 16 | Ht 62.0 in | Wt 140.0 lb

## 2023-01-07 DIAGNOSIS — C541 Malignant neoplasm of endometrium: Secondary | ICD-10-CM

## 2023-01-07 DIAGNOSIS — E785 Hyperlipidemia, unspecified: Secondary | ICD-10-CM | POA: Diagnosis not present

## 2023-01-07 DIAGNOSIS — Z806 Family history of leukemia: Secondary | ICD-10-CM | POA: Insufficient documentation

## 2023-01-07 DIAGNOSIS — Z8049 Family history of malignant neoplasm of other genital organs: Secondary | ICD-10-CM | POA: Insufficient documentation

## 2023-01-07 DIAGNOSIS — Z79818 Long term (current) use of other agents affecting estrogen receptors and estrogen levels: Secondary | ICD-10-CM | POA: Diagnosis not present

## 2023-01-07 DIAGNOSIS — K589 Irritable bowel syndrome without diarrhea: Secondary | ICD-10-CM | POA: Diagnosis not present

## 2023-01-07 DIAGNOSIS — R634 Abnormal weight loss: Secondary | ICD-10-CM | POA: Diagnosis not present

## 2023-01-07 DIAGNOSIS — K219 Gastro-esophageal reflux disease without esophagitis: Secondary | ICD-10-CM | POA: Insufficient documentation

## 2023-01-07 MED ORDER — TRAMADOL HCL 50 MG PO TABS
50.0000 mg | ORAL_TABLET | Freq: Four times a day (QID) | ORAL | 0 refills | Status: DC | PRN
Start: 2023-01-07 — End: 2023-03-06

## 2023-01-07 MED ORDER — SENNOSIDES-DOCUSATE SODIUM 8.6-50 MG PO TABS
2.0000 | ORAL_TABLET | Freq: Every day | ORAL | 0 refills | Status: DC
Start: 2023-01-07 — End: 2023-03-06

## 2023-01-07 NOTE — H&P (View-Only) (Signed)
 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  We reviewed the nature of endometrial cancer and its recommended surgical staging, including total hysterectomy, bilateral salpingo-oophorectomy, and lymph node assessment. The patient is a suitable candidate for staging via a minimally invasive approach to surgery.  We reviewed that robotic assistance would be used to complete the surgery.   We discussed that most endometrial cancer is detected early, however, we reviewed that adjuvant therapy may recommended based on the patient's biopsy, however, we will defer to final pathology results.    We reviewed the sentinel lymph node technique. Risks and benefits of sentinel lymph node biopsy was reviewed. We reviewed the technique and ICG dye. The patient DOES NOT have an iodine allergy or known liver dysfunction. We reviewed the false negative rate (0.4%), and that 3% of patients with metastatic disease will not have it detected by SLN biopsy in endometrial cancer. A low risk of allergic reaction to the dye, <0.2% for ICG, has been reported. We also discussed that in the case of failed mapping, which occurs 40% of the time, a bilateral or unilateral lymphadenectomy will be performed at the surgeon's discretion.   Potential benefits of sentinel nodes including a higher detection rate for metastasis due to ultrastaging and potential reduction in operative morbidity. However, there remains uncertainty as to the role for treatment of micrometastatic disease. Further, the benefit of operative morbidity associated with the SLN technique in endometrial cancer is not yet completely known. In other patient populations (e.g. the cervical cancer population) there has been observed reductions in morbidity with SLN biopsy compared to pelvic lymphadenectomy.  Lymphedema, nerve dysfunction and lymphocysts are all potential risks with the SLN technique as with complete lymphadenectomy. Additional risks to the patient include the risk of damage to an internal organ while operating in an altered view (e.g. the black and white image of the robotic fluorescence imaging mode).   Patient was consented for: Robotic assisted total laparoscopic hysterectomy, bilateral salpingo-oophorectomy, sentinel lymph node evaluation and biopsy, possible lymph node dissection, omental biopsy on 01/22/23.  The risks of surgery were discussed in detail and she understands these to including but not limited to bleeding requiring a blood transfusion, infection, injury to adjacent organs (including but not limited to the bowels, bladder, ureters, nerves, blood vessels), thromboembolic events, wound separation, hernia, vaginal cuff separation, possible risk of lymphedema and lymphocyst if lymphadenectomy performed, unforseen complication, and possible need for re-exploration.  If the patient experiences any of these events, she understands that her hospitalization or recovery may be prolonged and that she may need to take additional medications for a prolonged period. The patient will receive DVT and antibiotic prophylaxis as indicated. She voiced a clear understanding. She had the opportunity to ask questions and informed consent was obtained today. She wishes to proceed.  Recommend preop CT chest to complete staging imaging.  She does not require preoperative clearance. Her METs are >4.  All preoperative instructions were reviewed. Postoperative expectations were also reviewed. Written handouts were provided to the patient.  Plan 3 week postop visit as pt's daughter is out of town until September 6-17th.   A copy of this note was sent to the patient's referring provider.  Clide Cliff, MD Gynecologic Oncology   Medical Decision Making I personally spent  TOTAL 70 minutes  face-to-face and non-face-to-face in the care of  this patient, which includes all pre, intra, and post visit time on the date of service.   ------------  CC: Endometrial cancer  HISTORY OF PRESENT ILLNESS:  Carrie Newman is a 72 y.o. woman who is seen in consultation at the request of Lorriane Shire, MD for evaluation of clear cell endometrial cancer.  Patient presented to Ob/Gyn on 12/31/2022 for postmenopausal bleeding.  At that time she reported the bleeding started in May.  It started off light and is continued since that time.  She reports presenting to her PCP who had her undergo a CT abdomen/pelvis and then a pelvic ultrasound.  CT scan on 11/26/2022 was overall negative.  Pelvic ultrasound on 12/14/2022 showed marked thickening of the endometrium measuring up to 32 mm.  On 12/31/2022 with her OB/GYN, an endometrial biopsy was obtained.  She was also started on Megace 40 mg twice daily.  Pathology returned with clear-cell carcinoma.  Today pt presents with her daughter.  She endorses the history above.  She continues to have some light bleeding.  She also notes a 10 to 15 pound weight loss since May.  She otherwise denies abdominal bloating, early satiety, change in bowel or bladder habits, but does note that she sometimes feels like things get stuck in her throat.  Assistance of in-person Spanish interpreter during today's visit.    PAST MEDICAL HISTORY: Past Medical History:  Diagnosis Date   GERD (gastroesophageal reflux disease)    Hyperlipidemia    IBS (irritable bowel syndrome)     PAST SURGICAL HISTORY: Past Surgical History:  Procedure Laterality Date   BREAST BIOPSY Right    CESAREAN SECTION     1978   CESAREAN SECTION     1980   CESAREAN SECTION     1984    OB/GYN HISTORY: OB History  Gravida Para Term Preterm AB Living  3 3 3     2   SAB IAB Ectopic Multiple Live Births          3    # Outcome Date GA Lbr Len/2nd Weight Sex Type Anes PTL Lv  3 Term       CS-LTranv   DEC  2 Term      CS-LTranv   LIV  1 Term      CS-LTranv   LIV      Age at menarche: 10 Age at menopause: 72 Hx of HRT: no Hx of STI: no Last pap: In 64s History of abnormal pap smears: no  SCREENING STUDIES:  Last mammogram: 2023 Last colonoscopy: 2014, due last month and deferred for current work-up  MEDICATIONS:  Current Outpatient Medications:    megestrol (MEGACE) 40 MG tablet, Take 1 tablet (40 mg total) by mouth 2 (two) times daily. Can increase to two tablets twice a day in the event of heavy bleeding, Disp: 60 tablet, Rfl: 5  ALLERGIES: No Known Allergies  FAMILY HISTORY: Family History  Problem Relation Age of Onset   Leukemia Brother    Endometrial cancer Niece    Breast cancer Neg Hx    Colon cancer Neg Hx    Ovarian cancer Neg Hx    Pancreatic cancer Neg Hx    Prostate cancer Neg Hx     SOCIAL HISTORY: Social History   Socioeconomic History   Marital status: Married    Spouse name: Not on file   Number of children: Not on file   Years of education: Not on file   Highest education level: Not on  file  Occupational History   Not on file  Tobacco Use   Smoking status: Never   Smokeless tobacco: Never  Substance and Sexual Activity   Alcohol use: Not Currently   Drug use: Never   Sexual activity: Not Currently  Other Topics Concern   Not on file  Social History Narrative   Not on file   Social Determinants of Health   Financial Resource Strain: Not on file  Food Insecurity: Not on file  Transportation Needs: Not on file  Physical Activity: Not on file  Stress: Not on file  Social Connections: Not on file  Intimate Partner Violence: Not on file    REVIEW OF SYSTEMS: New patient intake form was reviewed.  Complete 10-system review is negative except for the following: Blood in urine, pelvic pain, back pain, vision problems, abdominal pain, vaginal bleeding, muscle pain, weight loss  PHYSICAL EXAM: BP (!) 147/77 (BP Location:  Right Arm, Patient Position: Sitting)   Pulse 84   Temp 98.3 F (36.8 C) (Oral)   Resp 16   Ht 5\' 2"  (1.575 m)   Wt 140 lb (63.5 kg)   SpO2 100%   BMI 25.61 kg/m  Constitutional: No acute distress. Neuro/Psych: Alert, oriented.  Head and Neck: Normocephalic, atraumatic. Neck symmetric without masses. Sclera anicteric.  Respiratory: Normal work of breathing. Clear to auscultation bilaterally. Cardiovascular: Regular rate and rhythm, no murmurs, rubs, or gallops. Abdomen: Normoactive bowel sounds. Soft, non-distended, non-tender to palpation. No masses appreciated. Well healed pfannenstiel incisions. Extremities: Grossly normal range of motion. Warm, well perfused. No edema bilaterally. Skin: No rashes or lesions. Lymphatic: No cervical, supraclavicular, or inguinal adenopathy. Genitourinary: External genitalia without lesions. Urethral meatus without lesions or prolapse. On speculum exam, vagina and cervix without lesions.  Maroon discharge consistent with bloody discharge.  No active bleeding from cervix.  Bimanual exam reveals normal cervix and small mobile uterus.  No pelvic mass. Exam chaperoned by Warner Mccreedy, NP   LABORATORY AND RADIOLOGIC DATA: Outside medical records were reviewed to synthesize the above history, along with the history and physical obtained during the visit.  Outside laboratory, pathology, and imaging reports were reviewed, with pertinent results below.  I personally reviewed the outside images.  No results found for: "WBC", "HGB", "HCT", "PLT", "LDH", "MG", "CREATININE", "AST", "ALT", "CAN125", "CA125", "CEA", "AFPTM", "CA199", "HCGTM", "DIAGPAP", "HPV"  Surgical pathology (12/31/22): FINAL MICROSCOPIC DIAGNOSIS:   A. ENDOMETRIUM, BIOPSY:  Clear cell carcinoma.  See comment.   COMMENT:  The carcinoma shows patchy positivity with Napsin A and is negative with  P504S, estrogen receptor and progesterone receptor.  Immunohistochemistry for p53 shows  wild-type (non mutated) pattern.  The  findings are consistent with endometrial clear cell carcinoma.   Dr. Reynolds Bowl agrees.   US PELVIC COMPLETE WITH TRANSVAGINAL 12/14/2022  Narrative CLINICAL DATA:  Abnormal appearance of uterus on CT with possible fibroid versus endometrial mass  EXAM: TRANSABDOMINAL AND TRANSVAGINAL ULTRASOUND OF PELVIS  TECHNIQUE: Both transabdominal and transvaginal ultrasound examinations of the pelvis were performed. Transabdominal technique was performed for global imaging of the pelvis including uterus, ovaries, adnexal regions, and pelvic cul-de-sac. It was necessary to proceed with endovaginal exam following the transabdominal exam to visualize the endometrium and ovaries.  COMPARISON:  CT abdomen pelvis 11/26/2022  FINDINGS: Uterus  Measurements: 7.6 x 4.5 x 6.0 cm = volume: 108 mL. 3 cm calcified fibroid noted along the left uterine fundus.  Endometrium  Thickness: 32 mm.  Markedly abnormally thickened.  Right ovary  Not visualized.  Left ovary  Not visualized.  Other findings  No abnormal free fluid.  IMPRESSION: Marked abnormal thickening of the endometrium measuring up to 32 mm highly suspicious for malignancy. Endometrial thickness is considered abnormal for an asymptomatic post-menopausal female. Endometrial sampling should be considered to exclude carcinoma.   Electronically Signed By: Acquanetta Belling M.D. On: 12/22/2022 10:05   CT ABDOMEN PELVIS W WO CONTRAST 11/26/2022  Narrative CLINICAL DATA:  Abdominal pain, gross hematuria  EXAM: CT ABDOMEN AND PELVIS WITHOUT AND WITH CONTRAST  TECHNIQUE: Multidetector CT imaging of the abdomen and pelvis was performed following the standard protocol before and following the bolus administration of intravenous contrast.  RADIATION DOSE REDUCTION: This exam was performed according to the departmental dose-optimization program which includes automated exposure control,  adjustment of the mA and/or kV according to patient size and/or use of iterative reconstruction technique.  CONTRAST:  ISOVUE-300 IOPAMIDOL (ISOVUE-300) INJECTION 61%  COMPARISON:  100 mL Isovue 300 IV  FINDINGS: Lower chest: Lung bases are clear.  Hepatobiliary: Liver is within normal limits.  Layering small gallstones, without associated inflammatory changes. No intrahepatic or extrahepatic ductal dilatation.  Pancreas: Within normal limits.  Spleen: Within normal limits.  Adrenals/Urinary Tract: Adrenal glands are within normal limits.  Kidneys are within normal limits.  No renal, ureteral, or bladder calculi.  No hydronephrosis.  On delayed imaging, there are no filling defects in the bilateral opacified proximal collecting systems, ureters, or bladder.  Bladder is within normal limits.  Stomach/Bowel: Stomach is notable for a small hiatal hernia.  No evidence of bowel obstruction.  Normal appendix (series 10/image 83).  Left colonic diverticulosis, without evidence of diverticulitis.  Vascular/Lymphatic: No evidence of abdominal aortic aneurysm.  No suspicious abdominopelvic lymphadenopathy.  Reproductive: Calcified uterine fibroid. Additional central fundal lesion may reflect a 3.5 cm noncalcified fibroid versus endometrial thickening.  Bilateral ovaries are within normal limits.  Other: No abdominopelvic ascites.  Musculoskeletal: Visualized osseous structures are within normal limits.  IMPRESSION: No CT findings to account for the patient's gross hematuria.  3.5 cm fundal lesion, possibly reflecting a noncalcified fibroid versus endometrial thickening. Pelvic ultrasound is suggested for further evaluation.  Cholelithiasis, without associated inflammatory changes.   Electronically Signed By: Charline Bills M.D. On: 12/01/2022 04:40

## 2023-01-07 NOTE — Progress Notes (Signed)
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  We reviewed the nature of endometrial cancer and its recommended surgical staging, including total hysterectomy, bilateral salpingo-oophorectomy, and lymph node assessment. The patient is a suitable candidate for staging via a minimally invasive approach to surgery.  We reviewed that robotic assistance would be used to complete the surgery.   We discussed that most endometrial cancer is detected early, however, we reviewed that adjuvant therapy may recommended based on the patient's biopsy, however, we will defer to final pathology results.    We reviewed the sentinel lymph node technique. Risks and benefits of sentinel lymph node biopsy was reviewed. We reviewed the technique and ICG dye. The patient DOES NOT have an iodine allergy or known liver dysfunction. We reviewed the false negative rate (0.4%), and that 3% of patients with metastatic disease will not have it detected by SLN biopsy in endometrial cancer. A low risk of allergic reaction to the dye, <0.2% for ICG, has been reported. We also discussed that in the case of failed mapping, which occurs 40% of the time, a bilateral or unilateral lymphadenectomy will be performed at the surgeon's discretion.   Potential benefits of sentinel nodes including a higher detection rate for metastasis due to ultrastaging and potential reduction in operative morbidity. However, there remains uncertainty as to the role for treatment of micrometastatic disease. Further, the benefit of operative morbidity associated with the SLN technique in endometrial cancer is not yet completely known. In other patient populations (e.g. the cervical cancer population) there has been observed reductions in morbidity with SLN biopsy compared to pelvic lymphadenectomy.  Lymphedema, nerve dysfunction and lymphocysts are all potential risks with the SLN technique as with complete lymphadenectomy. Additional risks to the patient include the risk of damage to an internal organ while operating in an altered view (e.g. the black and white image of the robotic fluorescence imaging mode).   Patient was consented for: Robotic assisted total laparoscopic hysterectomy, bilateral salpingo-oophorectomy, sentinel lymph node evaluation and biopsy, possible lymph node dissection, omental biopsy on 01/22/23.  The risks of surgery were discussed in detail and she understands these to including but not limited to bleeding requiring a blood transfusion, infection, injury to adjacent organs (including but not limited to the bowels, bladder, ureters, nerves, blood vessels), thromboembolic events, wound separation, hernia, vaginal cuff separation, possible risk of lymphedema and lymphocyst if lymphadenectomy performed, unforseen complication, and possible need for re-exploration.  If the patient experiences any of these events, she understands that her hospitalization or recovery may be prolonged and that she may need to take additional medications for a prolonged period. The patient will receive DVT and antibiotic prophylaxis as indicated. She voiced a clear understanding. She had the opportunity to ask questions and informed consent was obtained today. She wishes to proceed.  Recommend preop CT chest to complete staging imaging.  She does not require preoperative clearance. Her METs are >4.  All preoperative instructions were reviewed. Postoperative expectations were also reviewed. Written handouts were provided to the patient.  Plan 3 week postop visit as pt's daughter is out of town until September 6-17th.   A copy of this note was sent to the patient's referring provider.  Clide Cliff, MD Gynecologic Oncology   Medical Decision Making I personally spent  TOTAL 70 minutes  face-to-face and non-face-to-face in the care of  this patient, which includes all pre, intra, and post visit time on the date of service.   ------------  CC: Endometrial cancer  HISTORY OF PRESENT ILLNESS:  Carrie Newman is a 72 y.o. woman who is seen in consultation at the request of Lorriane Shire, MD for evaluation of clear cell endometrial cancer.  Patient presented to Ob/Gyn on 12/31/2022 for postmenopausal bleeding.  At that time she reported the bleeding started in May.  It started off light and is continued since that time.  She reports presenting to her PCP who had her undergo a CT abdomen/pelvis and then a pelvic ultrasound.  CT scan on 11/26/2022 was overall negative.  Pelvic ultrasound on 12/14/2022 showed marked thickening of the endometrium measuring up to 32 mm.  On 12/31/2022 with her OB/GYN, an endometrial biopsy was obtained.  She was also started on Megace 40 mg twice daily.  Pathology returned with clear-cell carcinoma.  Today pt presents with her daughter.  She endorses the history above.  She continues to have some light bleeding.  She also notes a 10 to 15 pound weight loss since May.  She otherwise denies abdominal bloating, early satiety, change in bowel or bladder habits, but does note that she sometimes feels like things get stuck in her throat.  Assistance of in-person Spanish interpreter during today's visit.    PAST MEDICAL HISTORY: Past Medical History:  Diagnosis Date   GERD (gastroesophageal reflux disease)    Hyperlipidemia    IBS (irritable bowel syndrome)     PAST SURGICAL HISTORY: Past Surgical History:  Procedure Laterality Date   BREAST BIOPSY Right    CESAREAN SECTION     1978   CESAREAN SECTION     1980   CESAREAN SECTION     1984    OB/GYN HISTORY: OB History  Gravida Para Term Preterm AB Living  3 3 3     2   SAB IAB Ectopic Multiple Live Births          3    # Outcome Date GA Lbr Len/2nd Weight Sex Type Anes PTL Lv  3 Term       CS-LTranv   DEC  2 Term      CS-LTranv   LIV  1 Term      CS-LTranv   LIV      Age at menarche: 10 Age at menopause: 70 Hx of HRT: no Hx of STI: no Last pap: In 64s History of abnormal pap smears: no  SCREENING STUDIES:  Last mammogram: 2023 Last colonoscopy: 2014, due last month and deferred for current work-up  MEDICATIONS:  Current Outpatient Medications:    megestrol (MEGACE) 40 MG tablet, Take 1 tablet (40 mg total) by mouth 2 (two) times daily. Can increase to two tablets twice a day in the event of heavy bleeding, Disp: 60 tablet, Rfl: 5  ALLERGIES: No Known Allergies  FAMILY HISTORY: Family History  Problem Relation Age of Onset   Leukemia Brother    Endometrial cancer Niece    Breast cancer Neg Hx    Colon cancer Neg Hx    Ovarian cancer Neg Hx    Pancreatic cancer Neg Hx    Prostate cancer Neg Hx     SOCIAL HISTORY: Social History   Socioeconomic History   Marital status: Married    Spouse name: Not on file   Number of children: Not on file   Years of education: Not on file   Highest education level: Not on  file  Occupational History   Not on file  Tobacco Use   Smoking status: Never   Smokeless tobacco: Never  Substance and Sexual Activity   Alcohol use: Not Currently   Drug use: Never   Sexual activity: Not Currently  Other Topics Concern   Not on file  Social History Narrative   Not on file   Social Determinants of Health   Financial Resource Strain: Not on file  Food Insecurity: Not on file  Transportation Needs: Not on file  Physical Activity: Not on file  Stress: Not on file  Social Connections: Not on file  Intimate Partner Violence: Not on file    REVIEW OF SYSTEMS: New patient intake form was reviewed.  Complete 10-system review is negative except for the following: Blood in urine, pelvic pain, back pain, vision problems, abdominal pain, vaginal bleeding, muscle pain, weight loss  PHYSICAL EXAM: BP (!) 147/77 (BP Location:  Right Arm, Patient Position: Sitting)   Pulse 84   Temp 98.3 F (36.8 C) (Oral)   Resp 16   Ht 5\' 2"  (1.575 m)   Wt 140 lb (63.5 kg)   SpO2 100%   BMI 25.61 kg/m  Constitutional: No acute distress. Neuro/Psych: Alert, oriented.  Head and Neck: Normocephalic, atraumatic. Neck symmetric without masses. Sclera anicteric.  Respiratory: Normal work of breathing. Clear to auscultation bilaterally. Cardiovascular: Regular rate and rhythm, no murmurs, rubs, or gallops. Abdomen: Normoactive bowel sounds. Soft, non-distended, non-tender to palpation. No masses appreciated. Well healed pfannenstiel incisions. Extremities: Grossly normal range of motion. Warm, well perfused. No edema bilaterally. Skin: No rashes or lesions. Lymphatic: No cervical, supraclavicular, or inguinal adenopathy. Genitourinary: External genitalia without lesions. Urethral meatus without lesions or prolapse. On speculum exam, vagina and cervix without lesions.  Maroon discharge consistent with bloody discharge.  No active bleeding from cervix.  Bimanual exam reveals normal cervix and small mobile uterus.  No pelvic mass. Exam chaperoned by Warner Mccreedy, NP   LABORATORY AND RADIOLOGIC DATA: Outside medical records were reviewed to synthesize the above history, along with the history and physical obtained during the visit.  Outside laboratory, pathology, and imaging reports were reviewed, with pertinent results below.  I personally reviewed the outside images.  No results found for: "WBC", "HGB", "HCT", "PLT", "LDH", "MG", "CREATININE", "AST", "ALT", "CAN125", "CA125", "CEA", "AFPTM", "CA199", "HCGTM", "DIAGPAP", "HPV"  Surgical pathology (12/31/22): FINAL MICROSCOPIC DIAGNOSIS:   A. ENDOMETRIUM, BIOPSY:  Clear cell carcinoma.  See comment.   COMMENT:  The carcinoma shows patchy positivity with Napsin A and is negative with  P504S, estrogen receptor and progesterone receptor.  Immunohistochemistry for p53 shows  wild-type (non mutated) pattern.  The  findings are consistent with endometrial clear cell carcinoma.   Dr. Reynolds Bowl agrees.   US PELVIC COMPLETE WITH TRANSVAGINAL 12/14/2022  Narrative CLINICAL DATA:  Abnormal appearance of uterus on CT with possible fibroid versus endometrial mass  EXAM: TRANSABDOMINAL AND TRANSVAGINAL ULTRASOUND OF PELVIS  TECHNIQUE: Both transabdominal and transvaginal ultrasound examinations of the pelvis were performed. Transabdominal technique was performed for global imaging of the pelvis including uterus, ovaries, adnexal regions, and pelvic cul-de-sac. It was necessary to proceed with endovaginal exam following the transabdominal exam to visualize the endometrium and ovaries.  COMPARISON:  CT abdomen pelvis 11/26/2022  FINDINGS: Uterus  Measurements: 7.6 x 4.5 x 6.0 cm = volume: 108 mL. 3 cm calcified fibroid noted along the left uterine fundus.  Endometrium  Thickness: 32 mm.  Markedly abnormally thickened.  Right ovary  Not visualized.  Left ovary  Not visualized.  Other findings  No abnormal free fluid.  IMPRESSION: Marked abnormal thickening of the endometrium measuring up to 32 mm highly suspicious for malignancy. Endometrial thickness is considered abnormal for an asymptomatic post-menopausal female. Endometrial sampling should be considered to exclude carcinoma.   Electronically Signed By: Acquanetta Belling M.D. On: 12/22/2022 10:05   CT ABDOMEN PELVIS W WO CONTRAST 11/26/2022  Narrative CLINICAL DATA:  Abdominal pain, gross hematuria  EXAM: CT ABDOMEN AND PELVIS WITHOUT AND WITH CONTRAST  TECHNIQUE: Multidetector CT imaging of the abdomen and pelvis was performed following the standard protocol before and following the bolus administration of intravenous contrast.  RADIATION DOSE REDUCTION: This exam was performed according to the departmental dose-optimization program which includes automated exposure control,  adjustment of the mA and/or kV according to patient size and/or use of iterative reconstruction technique.  CONTRAST:  ISOVUE-300 IOPAMIDOL (ISOVUE-300) INJECTION 61%  COMPARISON:  100 mL Isovue 300 IV  FINDINGS: Lower chest: Lung bases are clear.  Hepatobiliary: Liver is within normal limits.  Layering small gallstones, without associated inflammatory changes. No intrahepatic or extrahepatic ductal dilatation.  Pancreas: Within normal limits.  Spleen: Within normal limits.  Adrenals/Urinary Tract: Adrenal glands are within normal limits.  Kidneys are within normal limits.  No renal, ureteral, or bladder calculi.  No hydronephrosis.  On delayed imaging, there are no filling defects in the bilateral opacified proximal collecting systems, ureters, or bladder.  Bladder is within normal limits.  Stomach/Bowel: Stomach is notable for a small hiatal hernia.  No evidence of bowel obstruction.  Normal appendix (series 10/image 83).  Left colonic diverticulosis, without evidence of diverticulitis.  Vascular/Lymphatic: No evidence of abdominal aortic aneurysm.  No suspicious abdominopelvic lymphadenopathy.  Reproductive: Calcified uterine fibroid. Additional central fundal lesion may reflect a 3.5 cm noncalcified fibroid versus endometrial thickening.  Bilateral ovaries are within normal limits.  Other: No abdominopelvic ascites.  Musculoskeletal: Visualized osseous structures are within normal limits.  IMPRESSION: No CT findings to account for the patient's gross hematuria.  3.5 cm fundal lesion, possibly reflecting a noncalcified fibroid versus endometrial thickening. Pelvic ultrasound is suggested for further evaluation.  Cholelithiasis, without associated inflammatory changes.   Electronically Signed By: Charline Bills M.D. On: 12/01/2022 04:40

## 2023-01-07 NOTE — Progress Notes (Signed)
Patient here for a pre-operative appointment prior to her scheduled surgery on 01/22/2023. She is scheduled for a robotic assisted total laparoscopic hysterectomy, bilateral salpingo-oophorectomy, sentinel lymph node biopsy, possible lymph node dissection, possible laparotomy, omentectomy. The surgery was discussed in detail.  See after visit summary for additional details.    Discussed post-op pain management in detail including the aspects of the enhanced recovery pathway.  Advised her that a new prescription would be sent in for Tramadol and it is only to be used for after her upcoming surgery.  We discussed the use of tylenol post-op and to monitor for a maximum of 4,000 mg in a 24 hour period.  Also prescribed sennakot to be used after surgery and to hold if having loose stools.  Discussed bowel regimen in detail.     Discussed the use of SCDs and measures to take at home to prevent DVT including frequent mobility.  Reportable signs and symptoms of DVT discussed. Post-operative instructions discussed and expectations for after surgery. Incisional care discussed as well including reportable signs and symptoms including erythema, drainage, wound separation.     30 minutes spent with the patient.  Verbalizing understanding of material discussed. No needs or concerns voiced at the end of the visit.   Advised patient to call for any needs.  Advised that her post-operative medications had been prescribed and could be picked up at any time.    This appointment is included in the global surgical bundle as pre-operative teaching and has no charge.

## 2023-01-07 NOTE — Patient Instructions (Signed)
Preparing for your Surgery  Plan for surgery on January 22, 2023 with Dr. Clide Cliff at [facility]. You will be scheduled for robotic assisted total laparoscopic hysterectomy (removal of the uterus and cervix), bilateral salpingo-oophorectomy (removal of both ovaries and fallopian tubes), sentinel lymph node biopsy, possible lymph node dissection, possible laparotomy (larger incision on your abdomen if needed), omentectomy (biopsy of the omentum).    Pre-operative Testing -You will receive a phone call from presurgical testing at [facility] to arrange for a pre-operative appointment and lab work.  -Bring your insurance card, copy of an advanced directive if applicable, medication list  -At that visit, you will be asked to sign a consent for a possible blood transfusion in case a transfusion becomes necessary during surgery.  The need for a blood transfusion is rare but having consent is a necessary part of your care.     -You should not be taking blood thinners or aspirin at least ten days prior to surgery unless instructed by your surgeon.  -Do not take supplements such as fish oil (omega 3), red yeast rice, turmeric before your surgery. You want to avoid medications with aspirin in them including headache powders such as BC or Goody's), Excedrin migraine.  Day Before Surgery at Home -You will be asked to take in a light diet the day before surgery. You will be advised you can have clear liquids up until 3 hours before your surgery.    Eat a light diet the day before surgery.  Examples including soups, broths, toast, yogurt, mashed potatoes.  AVOID GAS PRODUCING FOODS AND BEVERAGES. Things to avoid include carbonated beverages (fizzy beverages, sodas), raw fruits and raw vegetables (uncooked), or beans.   If your bowels are filled with gas, your surgeon will have difficulty visualizing your pelvic organs which increases your surgical risks.  Your role in  recovery Your role is to become active as soon as directed by your doctor, while still giving yourself time to heal.  Rest when you feel tired. You will be asked to do the following in order to speed your recovery:  - Cough and breathe deeply. This helps to clear and expand your lungs and can prevent pneumonia after surgery.  - STAY ACTIVE WHEN YOU GET HOME. Do mild physical activity. Walking or moving your legs help your circulation and body functions return to normal. Do not try to get up or walk alone the first time after surgery.   -If you develop swelling on one leg or the other, pain in the back of your leg, redness/warmth in one of your legs, please call the office or go to the Emergency Room to have a doppler to rule out a blood clot. For shortness of breath, chest pain-seek care in the Emergency Room as soon as possible. - Actively manage your pain. Managing your pain lets you move in comfort. We will ask you to rate your pain on a scale of zero to 10. It is your responsibility to tell your doctor or nurse where and how much you hurt so your pain can be treated.  Special Considerations -If you are diabetic, you may be placed on insulin after surgery to have closer control over your blood sugars to promote healing and recovery.  This does not mean that you will be discharged on insulin.  If applicable, your oral antidiabetics will be resumed when you are tolerating a solid diet.  -Your final pathology results from surgery should be available around  one week after surgery and the results will be relayed to you when available.  -Dr. Antionette Char is the surgeon that assists your GYN Oncologist with surgery.  If you end up staying the night, the next day after your surgery you will either see Dr. Pricilla Holm, Dr. Alvester Morin, or Dr. Antionette Char.  -FMLA forms can be faxed to [phone] and please allow 5-7 business days for completion.  Pain Management After Surgery -You have been  prescribed your pain medication and bowel regimen medications before surgery so that you can have these available when you are discharged from the hospital. The pain medication is for use ONLY AFTER surgery and a new prescription will not be given.   -Make sure that you have Tylenol and Ibuprofen IF YOU ARE ABLE TO TAKE THESE MEDICATIONS at home to use on a regular basis after surgery for pain control. We recommend alternating the medications every hour to six hours since they work differently and are processed in the body differently for pain relief.  -Review the attached handout on narcotic use and their risks and side effects.   Bowel Regimen -You have been prescribed Sennakot-S to take nightly to prevent constipation especially if you are taking the narcotic pain medication intermittently.  It is important to prevent constipation and drink adequate amounts of liquids. You can stop taking this medication when you are not taking pain medication and you are back on your normal bowel routine.  Risks of Surgery Risks of surgery are low but include bleeding, infection, damage to surrounding structures, re-operation, blood clots, and very rarely death.   Blood Transfusion Information (For the consent to be signed before surgery)  We will be checking your blood type before surgery so in case of emergencies, we will know what type of blood you would need.                                            WHAT IS A BLOOD TRANSFUSION?  A transfusion is the replacement of blood or some of its parts. Blood is made up of multiple cells which provide different functions. Red blood cells carry oxygen and are used for blood loss replacement. White blood cells fight against infection. Platelets control bleeding. Plasma helps clot blood. Other blood products are available for specialized needs, such as hemophilia or other clotting disorders. BEFORE THE TRANSFUSION  Who gives blood for transfusions?  You may be  able to donate blood to be used at a later date on yourself (autologous donation). Relatives can be asked to donate blood. This is generally not any safer than if you have received blood from a stranger. The same precautions are taken to ensure safety when a relative's blood is donated. Healthy volunteers who are fully evaluated to make sure their blood is safe. This is blood bank blood. Transfusion therapy is the safest it has ever been in the practice of medicine. Before blood is taken from a donor, a complete history is taken to make sure that person has no history of diseases nor engages in risky social behavior (examples are intravenous drug use or sexual activity with multiple partners). The donor's travel history is screened to minimize risk of transmitting infections, such as malaria. The donated blood is tested for signs of infectious diseases, such as HIV and hepatitis. The blood is then tested to be sure it is compatible  with you in order to minimize the chance of a transfusion reaction. If you or a relative donates blood, this is often done in anticipation of surgery and is not appropriate for emergency situations. It takes many days to process the donated blood. RISKS AND COMPLICATIONS Although transfusion therapy is very safe and saves many lives, the main dangers of transfusion include:  Getting an infectious disease. Developing a transfusion reaction. This is an allergic reaction to something in the blood you were given. Every precaution is taken to prevent this. The decision to have a blood transfusion has been considered carefully by your caregiver before blood is given. Blood is not given unless the benefits outweigh the risks.  AFTER SURGERY INSTRUCTIONS  Return to work: 4-6 weeks if applicable  Activity: 1. Be up and out of the bed during the day.  Take a nap if needed.  You may walk up steps but be careful and use the hand rail.  Stair climbing will tire you more than you think,  you may need to stop part way and rest.   2. No lifting or straining for 6 weeks over 10 pounds. No pushing, pulling, straining for 6 weeks.  3. No driving for around 1 week(s).  Do not drive if you are taking narcotic pain medicine and make sure that your reaction time has returned.   4. You can shower as soon as the next day after surgery. Shower daily.  Use your regular soap and water (not directly on the incision) and pat your incision(s) dry afterwards; don't rub.  No tub baths or submerging your body in water until cleared by your surgeon. If you have the soap that was given to you by pre-surgical testing that was used before surgery, you do not need to use it afterwards because this can irritate your incisions.   5. No sexual activity and nothing in the vagina for 12 weeks.  6. You may experience a small amount of clear drainage from your incisions, which is normal.  If the drainage persists, increases, or changes color please call the office.  7. Do not use creams, lotions, or ointments such as neosporin on your incisions after surgery until advised by your surgeon because they can cause removal of the dermabond glue on your incisions.    8. You may experience vaginal spotting after surgery or when the stitches at the top of the vagina begin to dissolve.  The spotting is normal but if you experience heavy bleeding, call our office.  9. Take Tylenol or ibuprofen first for pain if you are able to take these medications and only use narcotic pain medication for severe pain not relieved by the Tylenol or Ibuprofen.  Monitor your Tylenol intake to a max of 4,000 mg in a 24 hour period. You can alternate these medications after surgery.  Diet: 1. Low sodium Heart Healthy Diet is recommended but you are cleared to resume your normal (before surgery) diet after your procedure.  2. It is safe to use a laxative, such as Miralax or Colace, if you have difficulty moving your bowels. You have been  prescribed Sennakot-S to take at bedtime every evening after surgery to keep bowel movements regular and to prevent constipation.    Wound Care: 1. Keep clean and dry.  Shower daily.  Reasons to call the Doctor: Fever - Oral temperature greater than 100.4 degrees Fahrenheit Foul-smelling vaginal discharge Difficulty urinating Nausea and vomiting Increased pain at the site of the incision that  is unrelieved with pain medicine. Difficulty breathing with or without chest pain New calf pain especially if only on one side Sudden, continuing increased vaginal bleeding with or without clots.   Contacts: For questions or concerns you should contact:  Dr. Clide Cliff at [phone]  Warner Mccreedy, NP at [phone]  After Hours: call [phone] and have the GYN Oncologist paged/contacted (after 5 pm or on the weekends). You will speak with an after hours RN and let he or she know you have had surgery.  Messages sent via mychart are for non-urgent matters and are not responded to after hours so for urgent needs, please call the after hours number.

## 2023-01-08 ENCOUNTER — Ambulatory Visit (HOSPITAL_COMMUNITY)
Admission: RE | Admit: 2023-01-08 | Discharge: 2023-01-08 | Disposition: A | Discharge status: Home / Self Care | Payer: HMO | Source: Ambulatory Visit | Attending: Psychiatry | Admitting: Psychiatry

## 2023-01-08 DIAGNOSIS — C541 Malignant neoplasm of endometrium: Secondary | ICD-10-CM | POA: Diagnosis not present

## 2023-01-08 DIAGNOSIS — I700 Atherosclerosis of aorta: Secondary | ICD-10-CM | POA: Diagnosis not present

## 2023-01-08 DIAGNOSIS — E041 Nontoxic single thyroid nodule: Secondary | ICD-10-CM | POA: Diagnosis not present

## 2023-01-08 DIAGNOSIS — K449 Diaphragmatic hernia without obstruction or gangrene: Secondary | ICD-10-CM | POA: Diagnosis not present

## 2023-01-08 MED ORDER — IOHEXOL 300 MG/ML  SOLN
100.0000 mL | Freq: Once | INTRAMUSCULAR | Status: AC | PRN
  Administered 2023-01-08: 100 mL via INTRAVENOUS

## 2023-01-11 ENCOUNTER — Telehealth: Payer: Self-pay

## 2023-01-11 NOTE — Progress Notes (Signed)
COVID Vaccine received:  [x]  No []  Yes Date of any COVID positive Test in last 90 days: No PCP - Myles Lipps DO Cardiologist -   Chest x-ray - Chest CT 01/08/23 EPIC EKG -   Stress Test -  ECHO -  Cardiac Cath -   Bowel Prep - [x]  No  []   Yes ______  Pacemaker / ICD device [x]  No []  Yes   Spinal Cord Stimulator:[x]  No []  Yes       History of Sleep Apnea? [x]  No []  Yes   CPAP used?- [x]  No []  Yes    Does the patient monitor blood sugar?          [x]  No []  Yes  []  N/A  Patient has: [x]  NO Hx DM   []  Pre-DM                 []  DM1  []   DM2 Does patient have a [employer] or Dexacom? []  No []  Yes   Fasting Blood Sugar Ranges-  Checks Blood Sugar _____ times a day  GLP1 agonist / usual dose - No GLP1 instructions:  SGLT-2 inhibitors / usual dose - No SGLT-2 instructions:   Blood Thinner / Instructions:No Aspirin Instructions:No  Comments:   Activity level: Patient is ableto climb a flight of stairs without difficulty; [x]  No CP  [x]  No SOB, but would have ___   Patient can perform ADLs without assistance.   Anesthesia review:   Patient denies shortness of breath, fever, cough and chest pain at PAT appointment.  Patient verbalized understanding and agreement to the Pre-Surgical Instructions that were given to them at this PAT appointment. Patient was also educated of the need to review these PAT instructions again prior to his/her surgery.I reviewed the appropriate phone numbers to call if they have any and questions or concerns.

## 2023-01-11 NOTE — Telephone Encounter (Signed)
Patients daughter called requesting records to be sent to another Dr office for a 2nd opinion.  I advised her that we don't send records for that and advised she would need to speak with the referring office to have them send over their records.  The referring MD was Dr Briscoe Deutscher.  She confirmed and understood.

## 2023-01-11 NOTE — Patient Instructions (Signed)
SURGICAL WAITING ROOM VISITATION  Patients having surgery or a procedure may have no more than 2 support people in the waiting area - these visitors may rotate.    Children under the age of 31 must have an adult with them who is not the patient.  Due to an increase in RSV and influenza rates and associated hospitalizations, children ages 70 and under may not visit patients in [facility] hospitals.  If the patient needs to stay at the hospital during part of their recovery, the visitor guidelines for inpatient rooms apply. Pre-op nurse will coordinate an appropriate time for 1 support person to accompany patient in pre-op.  This support person may not rotate.    Please refer to the [facility] website for the visitor guidelines for Inpatients (after your surgery is over and you are in a regular room).       Your procedure is scheduled on: 01/22/23   Report to [facility] Main Entrance    Report to admitting at  6:30 AM   Call this number if you have problems the morning of surgery [phone]   Do not eat food :After Midnight.   After Midnight you may have the following liquids until  5:45 DAY OF SURGERY  Water Non-Citrus Juices (without pulp, NO RED-Apple, White grape, White cranberry) Black Coffee (NO MILK/CREAM OR CREAMERS, sugar ok)  Clear Tea (NO MILK/CREAM OR CREAMERS, sugar ok) regular and decaf                             Plain Jell-O (NO RED)                                           Fruit ices (not with fruit pulp, NO RED)                                     Popsicles (NO RED)                                                               Sports drinks like Gatorade (NO RED)                 Oral Hygiene is also important to reduce your risk of infection.                                    Remember - BRUSH YOUR TEETH THE MORNING OF SURGERY WITH YOUR REGULAR TOOTHPASTE  DENTURES WILL BE REMOVED PRIOR TO SURGERY PLEASE DO NOT APPLY "Poly grip" OR  ADHESIVES!!!   Stop all vitamins and herbal supplements 7 days before surgery.   Take these medicines the morning of surgery : Megace if needed             You may not have any metal on your body including hair pins, jewelry, and body piercing             Do not wear make-up, lotions, powders, perfumes,  or deodorant  Do not wear nail polish including gel and S&S, artificial/acrylic nails, or any other type of covering on natural nails including finger and toenails. If you have artificial nails, gel coating, etc. that needs to be removed by a nail salon please have this removed prior to surgery or surgery may need to be canceled/ delayed if the surgeon/ anesthesia feels like they are unable to be safely monitored.   Do not shave  48 hours prior to surgery.    Do not bring valuables to the hospital. [facility] IS NOT             RESPONSIBLE   FOR VALUABLES.   Contacts, glasses, dentures or bridgework may not be worn into surgery.   Bring small overnight bag day of surgery.   DO NOT BRING YOUR HOME MEDICATIONS TO THE HOSPITAL. PHARMACY WILL DISPENSE MEDICATIONS LISTED ON YOUR MEDICATION LIST TO YOU DURING YOUR ADMISSION IN THE HOSPITAL!    Patients discharged on the day of surgery will not be allowed to drive home.  Someone NEEDS to stay with you for the first 24 hours after anesthesia.   Special Instructions: Bring a copy of your healthcare power of attorney and living will documents the day of surgery if you haven't scanned them before.              Please read over the following fact sheets you were given: IF YOU HAVE QUESTIONS ABOUT YOUR PRE-OP INSTRUCTIONS PLEASE CALL [phone] Carrie Newman   If you received a COVID test during your pre-op visit  it is requested that you wear a mask when out in public, stay away from anyone that may not be feeling well and notify your surgeon if you develop symptoms. If you test positive for Covid or have been in contact with anyone that has tested  positive in the last 10 days please notify you surgeon.    [facility] - Preparing for Surgery Before surgery, you can play an important role.  Because skin is not sterile, your skin needs to be as free of germs as possible.  You can reduce the number of germs on your skin by washing with CHG (chlorahexidine gluconate) soap before surgery.  CHG is an antiseptic cleaner which kills germs and bonds with the skin to continue killing germs even after washing. Please DO NOT use if you have an allergy to CHG or antibacterial soaps.  If your skin becomes reddened/irritated stop using the CHG and inform your nurse when you arrive at Short Stay. Do not shave (including legs and underarms) for at least 48 hours prior to the first CHG shower.  You may shave your face/neck.  Please follow these instructions carefully:  1.  Shower with CHG Soap the night before surgery and the  morning of surgery.  2.  If you choose to wash your hair, wash your hair first as usual with your normal  shampoo.  3.  After you shampoo, rinse your hair and body thoroughly to remove the shampoo.                             4.  Use CHG as you would any other liquid soap.  You can apply chg directly to the skin and wash.  Gently with a scrungie or clean washcloth.  5.  Apply the CHG Soap to your body ONLY FROM THE NECK DOWN.   Do   not use  on face/ open                           Wound or open sores. Avoid contact with eyes, ears mouth and   genitals (private parts).                       Wash face,  Genitals (private parts) with your normal soap.             6.  Wash thoroughly, paying special attention to the area where your    surgery  will be performed.  7.  Thoroughly rinse your body with warm water from the neck down.  8.  DO NOT shower/wash with your normal soap after using and rinsing off the CHG Soap.                9.  Pat yourself dry with a clean towel.            10.  Wear clean pajamas.            11.  Place clean sheets  on your bed the night of your first shower and do not  sleep with pets. Day of Surgery : Do not apply any lotions/deodorants the morning of surgery.  Please wear clean clothes to the hospital/surgery center.  FAILURE TO FOLLOW THESE INSTRUCTIONS MAY RESULT IN THE CANCELLATION OF YOUR SURGERY  PATIENT SIGNATURE_________________________________  NURSE SIGNATURE__________________________________  ________________________________________________________________________ WHAT IS A BLOOD TRANSFUSION? Blood Transfusion Information  A transfusion is the replacement of blood or some of its parts. Blood is made up of multiple cells which provide different functions. Red blood cells carry oxygen and are used for blood loss replacement. White blood cells fight against infection. Platelets control bleeding. Plasma helps clot blood. Other blood products are available for specialized needs, such as hemophilia or other clotting disorders. BEFORE THE TRANSFUSION  Who gives blood for transfusions?  Healthy volunteers who are fully evaluated to make sure their blood is safe. This is blood bank blood. Transfusion therapy is the safest it has ever been in the practice of medicine. Before blood is taken from a donor, a complete history is taken to make sure that person has no history of diseases nor engages in risky social behavior (examples are intravenous drug use or sexual activity with multiple partners). The donor's travel history is screened to minimize risk of transmitting infections, such as malaria. The donated blood is tested for signs of infectious diseases, such as HIV and hepatitis. The blood is then tested to be sure it is compatible with you in order to minimize the chance of a transfusion reaction. If you or a relative donates blood, this is often done in anticipation of surgery and is not appropriate for emergency situations. It takes many days to process the donated blood. RISKS AND  COMPLICATIONS Although transfusion therapy is very safe and saves many lives, the main dangers of transfusion include:  Getting an infectious disease. Developing a transfusion reaction. This is an allergic reaction to something in the blood you were given. Every precaution is taken to prevent this. The decision to have a blood transfusion has been considered carefully by your caregiver before blood is given. Blood is not given unless the benefits outweigh the risks. AFTER THE TRANSFUSION Right after receiving a blood transfusion, you will usually feel much better and more energetic. This is especially true if your red blood cells have  gotten low (anemic). The transfusion raises the level of the red blood cells which carry oxygen, and this usually causes an energy increase. The nurse administering the transfusion will monitor you carefully for complications. HOME CARE INSTRUCTIONS  No special instructions are needed after a transfusion. You may find your energy is better. Speak with your caregiver about any limitations on activity for underlying diseases you may have. SEEK MEDICAL CARE IF:  Your condition is not improving after your transfusion. You develop redness or irritation at the intravenous (IV) site. SEEK IMMEDIATE MEDICAL CARE IF:  Any of the following symptoms occur over the next 12 hours: Shaking chills. You have a temperature by mouth above 102 F (38.9 C), not controlled by medicine. Chest, back, or muscle pain. People around you feel you are not acting correctly or are confused. Shortness of breath or difficulty breathing. Dizziness and fainting. You get a rash or develop hives. You have a decrease in urine output. Your urine turns a dark color or changes to pink, red, or brown. Any of the following symptoms occur over the next 10 days: You have a temperature by mouth above 102 F (38.9 C), not controlled by medicine. Shortness of breath. Weakness after normal activity. The  white part of the eye turns yellow (jaundice). You have a decrease in the amount of urine or are urinating less often. Your urine turns a dark color or changes to pink, red, or brown. Document Released: 05/04/2000 Document Revised: 07/30/2011 Document Reviewed: 12/22/2007 [facility] Patient Information 2014 [place], [place].

## 2023-01-14 ENCOUNTER — Encounter (HOSPITAL_COMMUNITY)
Admission: RE | Admit: 2023-01-14 | Discharge: 2023-01-14 | Disposition: A | Discharge status: Home / Self Care | Payer: HMO | Source: Ambulatory Visit | Attending: Psychiatry | Admitting: Psychiatry

## 2023-01-14 ENCOUNTER — Other Ambulatory Visit: Payer: Self-pay

## 2023-01-14 ENCOUNTER — Encounter (HOSPITAL_COMMUNITY): Payer: Self-pay

## 2023-01-14 DIAGNOSIS — Z01812 Encounter for preprocedural laboratory examination: Secondary | ICD-10-CM | POA: Diagnosis not present

## 2023-01-14 DIAGNOSIS — C541 Malignant neoplasm of endometrium: Secondary | ICD-10-CM | POA: Diagnosis not present

## 2023-01-14 HISTORY — DX: Malignant (primary) neoplasm, unspecified: C80.1

## 2023-01-14 HISTORY — DX: Personal history of other diseases of the digestive system: Z87.19

## 2023-01-14 LAB — COMPREHENSIVE METABOLIC PANEL
ALT: 16 U/L (ref 0–44)
AST: 17 U/L (ref 15–41)
Albumin: 3.6 g/dL (ref 3.5–5.0)
Alkaline Phosphatase: 100 U/L (ref 38–126)
Anion gap: 11 (ref 5–15)
BUN: 17 mg/dL (ref 8–23)
CO2: 21 mmol/L — ABNORMAL LOW (ref 22–32)
Calcium: 9.3 mg/dL (ref 8.9–10.3)
Chloride: 105 mmol/L (ref 98–111)
Creatinine, Ser: 0.74 mg/dL (ref 0.44–1.00)
GFR, Estimated: >60 mL/min (ref >60)
Glucose, Bld: 105 mg/dL — ABNORMAL HIGH (ref 70–99)
Potassium: 3.9 mmol/L (ref 3.5–5.1)
Sodium: 137 mmol/L (ref 135–145)
Total Bilirubin: 0.5 mg/dL (ref 0.3–1.2)
Total Protein: 7.6 g/dL (ref 6.5–8.1)

## 2023-01-14 LAB — CBC
HCT: 39 % (ref 36.0–46.0)
Hemoglobin: 12.8 g/dL (ref 12.0–15.0)
MCH: 29.4 pg (ref 26.0–34.0)
MCHC: 32.8 g/dL (ref 30.0–36.0)
MCV: 89.7 fL (ref 80.0–100.0)
Platelets: 334 10*3/uL (ref 150–400)
RBC: 4.35 MIL/uL (ref 3.87–5.11)
RDW: 12.7 % (ref 11.5–15.5)
WBC: 8 10*3/uL (ref 4.0–10.5)
nRBC: 0 % (ref 0.0–0.2)

## 2023-01-15 ENCOUNTER — Telehealth: Payer: Self-pay | Admitting: *Deleted

## 2023-01-15 ENCOUNTER — Encounter: Payer: Self-pay | Admitting: Gynecologic Oncology

## 2023-01-15 NOTE — Telephone Encounter (Signed)
Spoke to Carrie Newman daughter Carrie Newman and relayed message from Carrie Mccreedy, NP that patient's CT scan of the chest does not show obvious cancer spread to the chest. "Good news"  They note a thyroid nodule and recommend further imaging to evaluate. We will get Carrie Newman set up for an ultrasound for this. Carrie Newman will also review the CT scan as well.  Carrie Newman verbalized understanding and advised the office would be following up to schedule the ultrasound. Carrie Newman thanked the office for calling and had no further concerns at this time.  Carrie Newman id # [id number] used for this call.

## 2023-01-15 NOTE — Telephone Encounter (Signed)
-----   Message from Doylene Bode sent at 01/15/2023  4:37 PM EDT ----- Please let her know Dr. Alvester Morin will review the CT scan of the chest as well. The CT chest DID NOT show obvious cancer spread to the chest. Good news! They note a thyroid nodule and recommend further imaging to evaluate. We will get her set up for an ultrasound for this.

## 2023-01-16 ENCOUNTER — Other Ambulatory Visit: Payer: Self-pay | Admitting: Gynecologic Oncology

## 2023-01-16 DIAGNOSIS — E041 Nontoxic single thyroid nodule: Secondary | ICD-10-CM

## 2023-01-17 ENCOUNTER — Telehealth: Payer: Self-pay

## 2023-01-17 NOTE — Telephone Encounter (Signed)
Called and spoke with daughter Byrd Hesselbach.  Scheduled [place] (mothers) [place] at [place] at 2pm on 8/30.  Patient daughter confirmed and understood.

## 2023-01-18 ENCOUNTER — Telehealth: Payer: Self-pay | Admitting: Surgery

## 2023-01-18 ENCOUNTER — Ambulatory Visit (HOSPITAL_COMMUNITY)
Admission: RE | Admit: 2023-01-18 | Discharge: 2023-01-18 | Disposition: A | Discharge status: Home / Self Care | Payer: HMO | Source: Ambulatory Visit | Attending: Gynecologic Oncology | Admitting: Gynecologic Oncology

## 2023-01-18 DIAGNOSIS — E041 Nontoxic single thyroid nodule: Secondary | ICD-10-CM | POA: Insufficient documentation

## 2023-01-18 DIAGNOSIS — E042 Nontoxic multinodular goiter: Secondary | ICD-10-CM | POA: Diagnosis not present

## 2023-01-18 NOTE — Telephone Encounter (Signed)
Telephone call to check on pre-operative status.  Patient compliant with pre-operative instructions.  Reinforced nothing to eat after midnight. Clear liquids until 5:30am. Patient to arrive at 6:30am. Verified that post-op medications have been sent to patient's preferred pharmacy.  No questions or concerns voiced.  Instructed to call for any needs.

## 2023-01-22 ENCOUNTER — Inpatient Hospital Stay (HOSPITAL_COMMUNITY)
Admission: RE | Admit: 2023-01-22 | Discharge: 2023-01-22 | DRG: 735 | Disposition: A | Discharge status: Home / Self Care | Payer: HMO | Attending: Psychiatry | Admitting: Psychiatry

## 2023-01-22 ENCOUNTER — Inpatient Hospital Stay (HOSPITAL_COMMUNITY): Payer: HMO | Admitting: Certified Registered Nurse Anesthetist

## 2023-01-22 ENCOUNTER — Other Ambulatory Visit: Payer: Self-pay

## 2023-01-22 ENCOUNTER — Encounter (HOSPITAL_COMMUNITY)
Admission: RE | Disposition: A | Discharge status: Home / Self Care | Payer: Self-pay | Source: Home / Self Care | Attending: Psychiatry

## 2023-01-22 ENCOUNTER — Other Ambulatory Visit: Payer: Self-pay | Admitting: Gynecologic Oncology

## 2023-01-22 ENCOUNTER — Encounter (HOSPITAL_COMMUNITY): Payer: Self-pay | Admitting: Psychiatry

## 2023-01-22 DIAGNOSIS — C541 Malignant neoplasm of endometrium: Principal | ICD-10-CM | POA: Diagnosis present

## 2023-01-22 DIAGNOSIS — Z6825 Body mass index (BMI) 25.0-25.9, adult: Secondary | ICD-10-CM | POA: Diagnosis not present

## 2023-01-22 DIAGNOSIS — K219 Gastro-esophageal reflux disease without esophagitis: Secondary | ICD-10-CM | POA: Diagnosis not present

## 2023-01-22 DIAGNOSIS — Z79899 Other long term (current) drug therapy: Secondary | ICD-10-CM | POA: Diagnosis not present

## 2023-01-22 DIAGNOSIS — N838 Other noninflammatory disorders of ovary, fallopian tube and broad ligament: Secondary | ICD-10-CM | POA: Diagnosis not present

## 2023-01-22 DIAGNOSIS — Z8049 Family history of malignant neoplasm of other genital organs: Secondary | ICD-10-CM | POA: Diagnosis not present

## 2023-01-22 DIAGNOSIS — N736 Female pelvic peritoneal adhesions (postinfective): Secondary | ICD-10-CM | POA: Diagnosis present

## 2023-01-22 DIAGNOSIS — N83299 Other ovarian cyst, unspecified side: Secondary | ICD-10-CM | POA: Diagnosis not present

## 2023-01-22 DIAGNOSIS — R634 Abnormal weight loss: Secondary | ICD-10-CM | POA: Diagnosis not present

## 2023-01-22 DIAGNOSIS — N950 Postmenopausal bleeding: Secondary | ICD-10-CM | POA: Diagnosis present

## 2023-01-22 DIAGNOSIS — Z806 Family history of leukemia: Secondary | ICD-10-CM | POA: Diagnosis not present

## 2023-01-22 DIAGNOSIS — E785 Hyperlipidemia, unspecified: Secondary | ICD-10-CM | POA: Diagnosis present

## 2023-01-22 DIAGNOSIS — N888 Other specified noninflammatory disorders of cervix uteri: Secondary | ICD-10-CM | POA: Diagnosis not present

## 2023-01-22 HISTORY — PX: SENTINEL NODE BIOPSY: SHX6608

## 2023-01-22 HISTORY — PX: LYMPH NODE DISSECTION: SHX5087

## 2023-01-22 LAB — TYPE AND SCREEN
ABO/RH(D): O POS
Antibody Screen: NEGATIVE

## 2023-01-22 SURGERY — XI ROBOTIC ASSISTED TOTAL HYSTERECTOMY BILATERAL SALPINGO OOPHORECTOMY WITH OMENTECTOMY AND DEBULKING
Anesthesia: General

## 2023-01-22 MED ORDER — ROCURONIUM BROMIDE 10 MG/ML (PF) SYRINGE
PREFILLED_SYRINGE | INTRAVENOUS | Status: AC
  Filled 2023-01-22: qty 10

## 2023-01-22 MED ORDER — STERILE WATER FOR INJECTION IJ SOLN
INTRAMUSCULAR | Status: AC
  Filled 2023-01-22: qty 10

## 2023-01-22 MED ORDER — FENTANYL CITRATE PF 50 MCG/ML IJ SOSY
PREFILLED_SYRINGE | INTRAMUSCULAR | Status: AC
  Administered 2023-01-22: 50 ug via INTRAVENOUS
  Filled 2023-01-22: qty 3

## 2023-01-22 MED ORDER — CEFAZOLIN SODIUM-DEXTROSE 2-4 GM/100ML-% IV SOLN
2.0000 g | INTRAVENOUS | Status: AC
  Administered 2023-01-22: 2 g via INTRAVENOUS
  Filled 2023-01-22: qty 100

## 2023-01-22 MED ORDER — ORAL CARE MOUTH RINSE: 15.0000 mL | Freq: Once | OROMUCOSAL | Status: AC

## 2023-01-22 MED ORDER — PROPOFOL 10 MG/ML IV BOLUS
INTRAVENOUS | Status: AC
  Filled 2023-01-22: qty 20

## 2023-01-22 MED ORDER — LIDOCAINE HCL 2 % IJ SOLN
INTRAMUSCULAR | Status: AC
  Filled 2023-01-22: qty 20

## 2023-01-22 MED ORDER — FENTANYL CITRATE (PF) 100 MCG/2ML IJ SOLN
INTRAMUSCULAR | Status: DC | PRN
  Administered 2023-01-22 (×5): 50 ug via INTRAVENOUS

## 2023-01-22 MED ORDER — KETAMINE HCL 10 MG/ML IJ SOLN
INTRAMUSCULAR | Status: DC | PRN
Start: 2023-01-22 — End: 2023-01-22
  Administered 2023-01-22: 20 mg via INTRAVENOUS

## 2023-01-22 MED ORDER — LIDOCAINE HCL (PF) 2 % IJ SOLN
INTRAMUSCULAR | Status: AC
  Filled 2023-01-22: qty 5

## 2023-01-22 MED ORDER — ONDANSETRON HCL 4 MG/2ML IJ SOLN
INTRAMUSCULAR | Status: AC
  Filled 2023-01-22: qty 2

## 2023-01-22 MED ORDER — LIDOCAINE HCL (PF) 2 % IJ SOLN
INTRAMUSCULAR | Status: DC | PRN
  Administered 2023-01-22: 1.5 mg/kg/h via INTRADERMAL

## 2023-01-22 MED ORDER — KETOROLAC TROMETHAMINE 30 MG/ML IJ SOLN
INTRAMUSCULAR | Status: DC | PRN
Start: 2023-01-22 — End: 2023-01-22
  Administered 2023-01-22: 30 mg via INTRAVENOUS

## 2023-01-22 MED ORDER — LACTATED RINGERS IR SOLN
Status: DC | PRN
  Administered 2023-01-22: 1

## 2023-01-22 MED ORDER — ACETAMINOPHEN 500 MG PO TABS
1000.0000 mg | ORAL_TABLET | Freq: Once | ORAL | Status: AC
  Administered 2023-01-22: 1000 mg via ORAL

## 2023-01-22 MED ORDER — PHENYLEPHRINE 80 MCG/ML (10ML) SYRINGE FOR IV PUSH (FOR BLOOD PRESSURE SUPPORT)
PREFILLED_SYRINGE | INTRAVENOUS | Status: AC
  Filled 2023-01-22: qty 10

## 2023-01-22 MED ORDER — SUGAMMADEX SODIUM 200 MG/2ML IV SOLN
INTRAVENOUS | Status: DC | PRN
  Administered 2023-01-22: 200 mg via INTRAVENOUS
  Administered 2023-01-22: 100 mg via INTRAVENOUS

## 2023-01-22 MED ORDER — INDOCYANINE GREEN 25 MG IV SOLR
INTRAVENOUS | Status: DC | PRN
Start: 2023-01-22 — End: 2023-01-23
  Administered 2023-01-22: 2.5 mg via TOPICAL

## 2023-01-22 MED ORDER — CHLORHEXIDINE GLUCONATE 0.12 % MT SOLN
15.0000 mL | Freq: Once | OROMUCOSAL | Status: AC
  Administered 2023-01-22: 15 mL via OROMUCOSAL

## 2023-01-22 MED ORDER — FENTANYL CITRATE PF 50 MCG/ML IJ SOSY
25.0000 ug | PREFILLED_SYRINGE | INTRAMUSCULAR | Status: DC | PRN
  Administered 2023-01-22 (×2): 50 ug via INTRAVENOUS

## 2023-01-22 MED ORDER — FENTANYL CITRATE (PF) 250 MCG/5ML IJ SOLN
INTRAMUSCULAR | Status: AC
  Filled 2023-01-22: qty 5

## 2023-01-22 MED ORDER — ONDANSETRON HCL 4 MG/2ML IJ SOLN
INTRAMUSCULAR | Status: DC | PRN
  Administered 2023-01-22: 4 mg via INTRAVENOUS

## 2023-01-22 MED ORDER — KETAMINE HCL 50 MG/5ML IJ SOSY
PREFILLED_SYRINGE | INTRAMUSCULAR | Status: AC
  Filled 2023-01-22: qty 5

## 2023-01-22 MED ORDER — STERILE WATER FOR INJECTION IJ SOLN
INTRAMUSCULAR | Status: DC | PRN
  Administered 2023-01-22: 4 mL

## 2023-01-22 MED ORDER — ACETAMINOPHEN 500 MG PO TABS
1000.0000 mg | ORAL_TABLET | ORAL | Status: AC
  Administered 2023-01-22: 1000 mg via ORAL
  Filled 2023-01-22: qty 2

## 2023-01-22 MED ORDER — BUPIVACAINE HCL 0.25 % IJ SOLN
INTRAMUSCULAR | Status: DC | PRN
  Administered 2023-01-22: 30 mL

## 2023-01-22 MED ORDER — HEPARIN SODIUM (PORCINE) 5000 UNIT/ML IJ SOLN
5000.0000 [IU] | INTRAMUSCULAR | Status: AC
  Administered 2023-01-22: 5000 [IU] via SUBCUTANEOUS
  Filled 2023-01-22: qty 1

## 2023-01-22 MED ORDER — MIDAZOLAM HCL 5 MG/5ML IJ SOLN
INTRAMUSCULAR | Status: DC | PRN
  Administered 2023-01-22: 2 mg via INTRAVENOUS

## 2023-01-22 MED ORDER — DEXAMETHASONE SODIUM PHOSPHATE 4 MG/ML IJ SOLN
4.0000 mg | INTRAMUSCULAR | Status: AC
  Administered 2023-01-22: 8 mg via INTRAVENOUS

## 2023-01-22 MED ORDER — LIDOCAINE 2% (20 MG/ML) 5 ML SYRINGE
INTRAMUSCULAR | Status: DC | PRN
  Administered 2023-01-22: 40 mg via INTRAVENOUS

## 2023-01-22 MED ORDER — PROPOFOL 10 MG/ML IV BOLUS
INTRAVENOUS | Status: DC | PRN
  Administered 2023-01-22: 100 mg via INTRAVENOUS
  Administered 2023-01-22: 30 mg via INTRAVENOUS

## 2023-01-22 MED ORDER — DEXAMETHASONE SODIUM PHOSPHATE 10 MG/ML IJ SOLN
INTRAMUSCULAR | Status: AC
  Filled 2023-01-22: qty 1

## 2023-01-22 MED ORDER — KETOROLAC TROMETHAMINE 30 MG/ML IJ SOLN
INTRAMUSCULAR | Status: AC
  Filled 2023-01-22: qty 1

## 2023-01-22 MED ORDER — ROCURONIUM BROMIDE 10 MG/ML (PF) SYRINGE
PREFILLED_SYRINGE | INTRAVENOUS | Status: DC | PRN
  Administered 2023-01-22: 50 mg via INTRAVENOUS
  Administered 2023-01-22 (×2): 20 mg via INTRAVENOUS

## 2023-01-22 MED ORDER — LACTATED RINGERS IV SOLN: INTRAVENOUS | Status: DC | PRN

## 2023-01-22 MED ORDER — BUPIVACAINE HCL 0.25 % IJ SOLN
INTRAMUSCULAR | Status: AC
  Filled 2023-01-22: qty 1

## 2023-01-22 MED ORDER — HEMOSTATIC AGENTS (NO CHARGE) OPTIME
TOPICAL | Status: DC | PRN
Start: 2023-01-22 — End: 2023-01-23
  Administered 2023-01-22: 1

## 2023-01-22 MED ORDER — MIDAZOLAM HCL 2 MG/2ML IJ SOLN
INTRAMUSCULAR | Status: AC
  Filled 2023-01-22: qty 2

## 2023-01-22 MED ORDER — LACTATED RINGERS IV SOLN: INTRAVENOUS | Status: DC

## 2023-01-22 MED ORDER — PHENYLEPHRINE 80 MCG/ML (10ML) SYRINGE FOR IV PUSH (FOR BLOOD PRESSURE SUPPORT)
PREFILLED_SYRINGE | INTRAVENOUS | Status: DC | PRN
  Administered 2023-01-22 (×2): 80 ug via INTRAVENOUS
  Administered 2023-01-22: 40 ug via INTRAVENOUS
  Administered 2023-01-22: 80 ug via INTRAVENOUS

## 2023-01-22 MED ORDER — STERILE WATER FOR IRRIGATION IR SOLN
Status: DC | PRN
  Administered 2023-01-22: 1000 mL

## 2023-01-22 SURGICAL SUPPLY — 82 items
ADH SKN CLS APL DERMABOND .7 (GAUZE/BANDAGES/DRESSINGS) ×2
AGENT HMST KT MTR STRL THRMB (HEMOSTASIS) ×2
APL ESCP 34 STRL LF DISP (HEMOSTASIS)
APPLICATOR SURGIFLO ENDO (HEMOSTASIS) IMPLANT
BAG LAPAROSCOPIC 12 15 PORT 16 (BASKET) IMPLANT
BAG RETRIEVAL 12/15 (BASKET)
BLADE SURG SZ10 CARB STEEL (BLADE) IMPLANT
COVER BACK TABLE 60X90IN (DRAPES) ×2 IMPLANT
COVER TIP SHEARS 8 DVNC (MISCELLANEOUS) ×2 IMPLANT
DERMABOND ADVANCED .7 DNX12 (GAUZE/BANDAGES/DRESSINGS) ×2 IMPLANT
DRAPE ARM DVNC X/XI (DISPOSABLE) ×8 IMPLANT
DRAPE COLUMN DVNC XI (DISPOSABLE) ×2 IMPLANT
DRAPE SHEET LG 3/4 BI-LAMINATE (DRAPES) ×2 IMPLANT
DRAPE SURG IRRIG POUCH 19X23 (DRAPES) ×2 IMPLANT
DRIVER NDL MEGA SUTCUT DVNCXI (INSTRUMENTS) ×2 IMPLANT
DRIVER NDLE MEGA SUTCUT DVNCXI (INSTRUMENTS) ×2
DRSG OPSITE POSTOP 4X6 (GAUZE/BANDAGES/DRESSINGS) IMPLANT
DRSG OPSITE POSTOP 4X8 (GAUZE/BANDAGES/DRESSINGS) IMPLANT
DRSG TEGADERM 4X4.75 (GAUZE/BANDAGES/DRESSINGS) IMPLANT
ELECT PENCIL ROCKER SW 15FT (MISCELLANEOUS) IMPLANT
ELECT REM PT RETURN 15FT ADLT (MISCELLANEOUS) ×2 IMPLANT
FORCEPS BPLR FENES DVNC XI (FORCEP) ×2 IMPLANT
FORCEPS PROGRASP DVNC XI (FORCEP) ×2 IMPLANT
GAUZE 4X4 16PLY ~~LOC~~+RFID DBL (SPONGE) ×2 IMPLANT
GLOVE BIO SURGEON STRL SZ 6 (GLOVE) ×8 IMPLANT
GLOVE BIO SURGEON STRL SZ 6.5 (GLOVE) ×2 IMPLANT
GLOVE BIOGEL PI IND STRL 6.5 (GLOVE) ×4 IMPLANT
GOWN STRL REUS W/ TWL LRG LVL3 (GOWN DISPOSABLE) ×8 IMPLANT
GOWN STRL REUS W/TWL LRG LVL3 (GOWN DISPOSABLE) ×8
GRASPER SUT TROCAR 14GX15 (MISCELLANEOUS) IMPLANT
HOLDER FOLEY CATH W/STRAP (MISCELLANEOUS) IMPLANT
IRRIG SUCT STRYKERFLOW 2 WTIP (MISCELLANEOUS) ×2
IRRIGATION SUCT STRKRFLW 2 WTP (MISCELLANEOUS) ×2 IMPLANT
KIT PROCEDURE DVNC SI (MISCELLANEOUS) IMPLANT
KIT TURNOVER KIT A (KITS) IMPLANT
LIGASURE IMPACT 36 18CM CVD LR (INSTRUMENTS) IMPLANT
MANIPULATOR ADVINCU DEL 3.0 PL (MISCELLANEOUS) IMPLANT
MANIPULATOR ADVINCU DEL 3.5 PL (MISCELLANEOUS) IMPLANT
MANIPULATOR UTERINE 4.5 ZUMI (MISCELLANEOUS) IMPLANT
NDL HYPO 21X1.5 SAFETY (NEEDLE) ×2 IMPLANT
NDL INSUFFLATION 14GA 120MM (NEEDLE) IMPLANT
NDL SPNL 20GX3.5 QUINCKE YW (NEEDLE) IMPLANT
NEEDLE HYPO 21X1.5 SAFETY (NEEDLE) ×2
NEEDLE INSUFFLATION 14GA 120MM (NEEDLE)
NEEDLE SPNL 20GX3.5 QUINCKE YW (NEEDLE)
OBTURATOR OPTICAL STND 8 DVNC (TROCAR) ×2
OBTURATOR OPTICALSTD 8 DVNC (TROCAR) ×2 IMPLANT
PACK ROBOT GYN CUSTOM WL (TRAY / TRAY PROCEDURE) ×2 IMPLANT
PAD ARMBOARD 7.5X6 YLW CONV (MISCELLANEOUS) ×2 IMPLANT
PAD POSITIONING PINK XL (MISCELLANEOUS) ×2 IMPLANT
PORT ACCESS TROCAR AIRSEAL 12 (TROCAR) IMPLANT
SCISSORS MNPLR CVD DVNC XI (INSTRUMENTS) ×2 IMPLANT
SCRUB CHG 4% DYNA-HEX 4OZ (MISCELLANEOUS) ×4 IMPLANT
SEAL UNIV 5-12 XI (MISCELLANEOUS) ×8 IMPLANT
SEALER TISSUE G2 CVD JAW 45CM (ENDOMECHANICALS) IMPLANT
SET TRI-LUMEN FLTR TB AIRSEAL (TUBING) ×2 IMPLANT
SPIKE FLUID TRANSFER (MISCELLANEOUS) ×2 IMPLANT
SPONGE T-LAP 18X18 ~~LOC~~+RFID (SPONGE) IMPLANT
SURGIFLO W/THROMBIN 8M KIT (HEMOSTASIS) IMPLANT
SUT MNCRL AB 4-0 PS2 18 (SUTURE) IMPLANT
SUT PDS AB 1 TP1 54 (SUTURE) IMPLANT
SUT VIC AB 0 CT1 27 (SUTURE)
SUT VIC AB 0 CT1 27XBRD ANTBC (SUTURE) IMPLANT
SUT VIC AB 2-0 CT1 27 (SUTURE)
SUT VIC AB 2-0 CT1 TAPERPNT 27 (SUTURE) IMPLANT
SUT VIC AB 3-0 SH 27 (SUTURE) ×2
SUT VIC AB 3-0 SH 27X BRD (SUTURE) IMPLANT
SUT VIC AB 4-0 PS2 18 (SUTURE) ×4 IMPLANT
SUT VICRYL 0 27 CT2 27 ABS (SUTURE) ×2 IMPLANT
SUT VLOC 180 0 9IN GS21 (SUTURE) IMPLANT
SYR 10ML LL (SYRINGE) IMPLANT
SYS BAG RETRIEVAL 10MM (BASKET) ×4
SYS WOUND ALEXIS 18CM MED (MISCELLANEOUS)
SYSTEM BAG RETRIEVAL 10MM (BASKET) IMPLANT
SYSTEM WOUND ALEXIS 18CM MED (MISCELLANEOUS) IMPLANT
TOWEL OR NON WOVEN STRL DISP B (DISPOSABLE) IMPLANT
TRAP SPECIMEN MUCUS 40CC (MISCELLANEOUS) IMPLANT
TRAY FOLEY MTR SLVR 16FR STAT (SET/KITS/TRAYS/PACK) ×2 IMPLANT
TROCAR PORT AIRSEAL 5X120 (TROCAR) IMPLANT
UNDERPAD 30X36 HEAVY ABSORB (UNDERPADS AND DIAPERS) ×4 IMPLANT
WATER STERILE IRR 1000ML POUR (IV SOLUTION) ×2 IMPLANT
YANKAUER SUCT BULB TIP 10FT TU (MISCELLANEOUS) IMPLANT

## 2023-01-22 NOTE — Interval H&P Note (Signed)
History and Physical Interval Note:  01/22/2023 8:09 AM  Carrie Newman  has presented today for surgery, with the diagnosis of ENDOMETRIAL CANCER.  The various methods of treatment have been discussed with the patient and family. After consideration of risks, benefits and other options for treatment, the patient has consented to  Procedure(s): XI ROBOTIC ASSISTED TOTAL HYSTERECTOMY BILATERAL SALPINGO OOPHORECTOMY WITH OMENTECTOMY (Bilateral) SENTINEL NODE BIOPSY (N/A) POSSIBLE LYMPH NODE DISSECTION (N/A) as a surgical intervention.  The patient's history has been reviewed, patient examined, no change in status, stable for surgery.  I have reviewed the patient's chart and labs.  Questions were answered to the patient's satisfaction.     [name]

## 2023-01-22 NOTE — Discharge Instructions (Addendum)
 AFTER SURGERY INSTRUCTIONS   Return to work: 4-6 weeks if applicable   Activity: 1. Be up and out of the bed during the day.  Take a nap if needed.  You may walk up steps but be careful and use the hand rail.  Stair climbing will tire you more than you think, you may need to stop part way and rest.    2. No lifting or straining for 6 weeks over 10 pounds. No pushing, pulling, straining for 6 weeks.   3. No driving for around 1 week(s).  Do not drive if you are taking narcotic pain medicine and make sure that your reaction time has returned.    4. You can shower as soon as the next day after surgery. Shower daily.  Use your regular soap and water (not directly on the incision) and pat your incision(s) dry afterwards; don't rub.  No tub baths or submerging your body in water until cleared by your surgeon. If you have the soap that was given to you by pre-surgical testing that was used before surgery, you do not need to use it afterwards because this can irritate your incisions.    5. No sexual activity and nothing in the vagina for 12 weeks.   6. You may experience a small amount of clear drainage from your incisions, which is normal.  If the drainage persists, increases, or changes color please call the office.   7. Do not use creams, lotions, or ointments such as neosporin on your incisions after surgery until advised by your surgeon because they can cause removal of the dermabond glue on your incisions.     8. You may experience vaginal spotting after surgery or when the stitches at the top of the vagina begin to dissolve.  The spotting is normal but if you experience heavy bleeding, call our office.   9. Take Tylenol or ibuprofen first for pain if you are able to take these medications and only use narcotic pain medication for severe pain not relieved by the Tylenol or Ibuprofen.  Monitor your Tylenol intake to a max of 4,000 mg in a 24 hour period. You can alternate these medications after  surgery.   Diet: 1. Low sodium Heart Healthy Diet is recommended but you are cleared to resume your normal (before surgery) diet after your procedure.   2. It is safe to use a laxative, such as Miralax or Colace, if you have difficulty moving your bowels. You have been prescribed Sennakot-S to take at bedtime every evening after surgery to keep bowel movements regular and to prevent constipation.     Wound Care: 1. Keep clean and dry.  Shower daily.   Reasons to call the Doctor: Fever - Oral temperature greater than 100.4 degrees Fahrenheit Foul-smelling vaginal discharge Difficulty urinating Nausea and vomiting Increased pain at the site of the incision that is unrelieved with pain medicine. Difficulty breathing with or without chest pain New calf pain especially if only on one side Sudden, continuing increased vaginal bleeding with or without clots.   Contacts: For questions or concerns you should contact:   Dr. Clide Cliff at [phone]   Warner Mccreedy, NP at [phone]   After Hours: call [phone] and have the GYN Oncologist paged/contacted (after 5 pm or on the weekends). You will speak with an after hours RN and let he or she know you have had surgery.   Messages sent via mychart are for non-urgent matters and are not responded to after  hours so for urgent needs, please call the after hours number.

## 2023-01-22 NOTE — Op Note (Addendum)
GYNECOLOGIC ONCOLOGY OPERATIVE NOTE  Date of Service: 01/22/2023  Preoperative Diagnosis: Clear cell endometrial cancer  Postoperative Diagnosis: Same, bladder adhesions  Procedures: Robotic-assisted total laparoscopic hysterectomy, bilateral salpingo-oophorectomy, bilateral sentinel lymph node evaluation, left sentinel lymph node biopsy, right pelvic and common iliac lymphadenectomy, omental biopsy (Modifer 22: increased duration of the procedure by >52min due to complexity due adhesive disease requiring extensive meticulous lysis of adhesions, necessitating additional instrumentation for retraction and safe exposure)  Surgeon: Clide Cliff, MD  Assistants: Antionette Char, MD and (an MD assistant was necessary for tissue manipulation, management of robotic instrumentation, retraction and positioning due to the complexity of the case and hospital policies)  Anesthesia: General  Estimated Blood Loss: 50 mL    Fluids: 1200 ml, crystalloid  Urine Output: 200 ml, clear yellow  Findings: Normal upper abdominal survey with normal liver surface and diaphragm. Normal appearing small and large bowel. Normal uterus, tubes, and ovaries. No evidence of peritoneal disease, ascites, or carcinomatosis. Sentinel mapping on left to the external iliac vein; no mapping on the right so a complete pelvic lymphadenectomy performed and a right common iliac lymphadenectomy. Para-aortic lymph node dissection deferred due to short mesentery limiting safe exposure of the para-aortic lymph nodes. Dense adhesions of the bladder to the anterior uterine serosa from prior cesarean surgeries. Superficial laceration of posterior introitus made hemostatic with one figure of eight stitch.  Specimens:  ID Type Source Tests Collected by Time Destination  1 : left external iliac sentinel lymph node Tissue PATH Lymph node excision SURGICAL PATHOLOGY Clide Cliff, MD 01/22/2023 1055   2 : uterus, cervix, bilateral tubes  and ovaries Tissue PATH Other SURGICAL PATHOLOGY Clide Cliff, MD 01/22/2023 1146   3 : right pelvic lymph node Tissue PATH Lymph node excision SURGICAL PATHOLOGY Clide Cliff, MD 01/22/2023 1211   4 : right common iliac lymph node Tissue PATH Lymph node excision SURGICAL PATHOLOGY Clide Cliff, MD 01/22/2023 1222   5 : omental biospy Tissue PATH GI biopsy SURGICAL PATHOLOGY Clide Cliff, MD 01/22/2023 1252   A : peritoneal washings Body Fluid Peritoneal Washings CYTOLOGY - NON PAP Clide Cliff, MD 01/22/2023 1033     Complications:  None  Indications for Procedure: Carrie Newman is a 72 y.o. woman with clear cell endometrial cancer.  Prior to the procedure, all risks, benefits, and alternatives were discussed and informed surgical consent was signed.  Procedure: Patient was taken to the operating room where general anesthesia was achieved.  She was positioned in dorsal lithotomy and prepped and draped.  A foley catheter was inserted into the bladder. 1 ml of dilute Indo-Cyanine dye was was injected at 1cm and 1mm deep at 3 and 9 o'clock in the cervical stroma.  The cervix was dilated and an Advincula uterine manipulator with a colpotomy ring was inserted into the uterus.  A 12 mm incision was made in the left upper quadrant near Palmer's point.  The abdomen was entered with a 5 mm OptiView trocar under direct visualization.  The abdomen was insufflated, the patient placed in steep Trendelenburg, and additional trocars were placed as follows: an 8mm trocar superior to the umbilicus, two 8 mm robotic trocars in the right abdomen, and one 8 mm robotic trocar in the left abdomen.  The left upper quadrant trocar was removed and replaced with a 12 mm airseal trocar.  All trocars were placed under direct visualization.  The bowels were moved into the upper abdomen.  The DaVinci robotic surgical system was brought to the  patient's bedside and docked.  Pelvic washings were obtained. The right round  ligament was transected and the retroperitoneum entered.The right ureter was identified. The paravesical and pararectal spaces were opened. No lymph node channels were identified. Attention was then turned to the left. The left round ligament was transected and the retroperitoneum entered.The left ureter was identified. The paravesical and pararectal spaces were opened, and the node was found to be located at the left external iliac. A sentinel lymph node dissection was performed taking care to avoid injury to the ureter, superior vesicle artery or obturator nerve. Attention was returned to the right and still no sentinel mapping was identified. The sentinel lymph node mentioned above were identified and removed through the assistant trocar.  The right ureter was again identified, and the right infundibulopelvic ligament was isolated, cauterized, and transected. The posterior peritoneum was opened to the colpotomy ring. The anterior peritoneum was opened and the bladder flap was initiated. Dense adhesions of the bladder to the anterior uterine serosa were encountered. The bladder was meticulously dissected from the cervix and uterus. The right uterine artery was skeletonized, cauterized, and transected at the level of the colpotomy ring. Additional cautery was used in a C-shaped fashion to allow the remainder of the broad, cardinal, and uterosacral ligaments with the uterine vessels to be transected and fall away from the colpotomy ring.  A similar procedure was performed on the contralateral side. The bladder flap dissection was completed. A colpotomy was made circumferentially following the contours of the colpotomy ring.  The uterine specimen was removed through the vagina.    Next, a right pelvic lymphadenectomy was performed using the following boundaries: the bifurcation of the common iliac vessels cephalad, the distal circumflex vein caudad, the genitofemoral nerve laterally, the superior vesical artery  medially, and the obturator nerve at the floor of the dissection. The specimen was placed in an Endo-Catch bag and removed through the vagina.  The peritoneum overlying the right common iliac vessels was opened and extended cephalad. Due to short mesentery of the small bowel, limiting exposure of the aorta, the incision was not further extended along the aorta. The right ureter was identified and retracted. The lymph nodes along the right common iliac artery were removed. This specimen was placed in an endocatch bag and removed through the vagina.   The vaginal cuff was closed with a running stitch of 0 Vicryl suture. The bladder was backfilled with 150 ml of saline. No leakage was noted. The bladder wall was noted to be entirely intact.  The pelvis was irrigated. Surgiflo was applied at the cuff and in the right pelvic lymphadenectomy bed. All operative sites were found to be hemostatic.  All instruments were removed and the robot was taken from the patient's bedside. An omental biopsy was obtained of the distal omentum using the enseal device. The biopsy tissue was removed through the assistant trocar. The omentum was noted to be hemostatic. The fascia at the 12 mm incision was closed with 0 Vicryl using a PMI device. The abdomen was desufflated and all ports were removed. The skin at all incisions was closed with 4-0 Vicryl to reapproximate the subcutaneous tissue and 4-0 monocryl in a subcuticular fashion followed by surgical glue.  Patient tolerated the procedure well. Sponge, lap, and instrument counts were correct.  Patient received 2 gm of Ancef prior to skin incision for routine perioperative antibiotic prophylaxis.  She was extubated and taken to the PACU in stable condition.  Clide Cliff, MD  Gynecologic Oncology

## 2023-01-22 NOTE — Brief Op Note (Signed)
01/22/2023  1:16 PM  PATIENT:  Carrie Newman  72 y.o. female  PRE-OPERATIVE DIAGNOSIS:  ENDOMETRIAL CANCER  POST-OPERATIVE DIAGNOSIS:  ENDOMETRIAL CANCER  PROCEDURE:  Procedure(s): XI ROBOTIC ASSISTED TOTAL HYSTERECTOMY BILATERAL SALPINGO OOPHORECTOMY WITH OMENTAL BIOPSY (Bilateral) LEFT SENTINEL NODE BIOPSY (N/A) RIGHT PELVIC LYMPHADENECTOMY (N/A)  SURGEON:  Surgeons and Role:    Clide Cliff, MD - Primary    * Antionette Char, MD - Assisting   ANESTHESIA:   general  EBL:  50 mL   BLOOD ADMINISTERED:none  DRAINS: none   LOCAL MEDICATIONS USED:  MARCAINE     SPECIMEN:   ID Type Source Tests Collected by Time Destination  1 : left external iliac sentinel lymph node Tissue PATH Lymph node excision SURGICAL PATHOLOGY Clide Cliff, MD 01/22/2023 1055   2 : uterus, cervix, bilateral tubes and ovaries Tissue PATH Other SURGICAL PATHOLOGY Clide Cliff, MD 01/22/2023 1146   3 : right pelvic lymph node Tissue PATH Lymph node excision SURGICAL PATHOLOGY Clide Cliff, MD 01/22/2023 1211   4 : right common iliac lymph node Tissue PATH Lymph node excision SURGICAL PATHOLOGY Clide Cliff, MD 01/22/2023 1222   5 : omental biospy Tissue PATH GI biopsy SURGICAL PATHOLOGY Clide Cliff, MD 01/22/2023 1252   A : peritoneal washings Body Fluid Peritoneal Washings CYTOLOGY - NON PAP Clide Cliff, MD 01/22/2023 1033     DISPOSITION OF SPECIMEN:  PATHOLOGY  COUNTS:  YES  TOURNIQUET:  * No tourniquets in log *  DICTATION: .Note written in EPIC  PLAN OF CARE: Discharge to home after PACU  PATIENT DISPOSITION:  PACU - hemodynamically stable.   Delay start of Pharmacological VTE agent (>24hrs) due to surgical blood loss or risk of bleeding: not applicable

## 2023-01-22 NOTE — Anesthesia Procedure Notes (Addendum)
Procedure Name: Intubation Date/Time: 01/22/2023 9:59 AM  Performed by: Ludwig Lean, CRNAPre-anesthesia Checklist: Patient identified, Emergency Drugs available, Suction available and Patient being monitored Patient Re-evaluated:Patient Re-evaluated prior to induction Oxygen Delivery Method: Circle system utilized Preoxygenation: Pre-oxygenation with 100% oxygen Induction Type: IV induction Ventilation: Mask ventilation without difficulty Laryngoscope Size: Mac and 3 Grade View: Grade I Tube type: Oral Tube size: 7.0 mm Number of attempts: 1 Airway Equipment and Method: Stylet and Oral airway Placement Confirmation: ETT inserted through vocal cords under direct vision, positive ETCO2 and breath sounds checked- equal and bilateral Secured at: 20 cm Tube secured with: Tape Dental Injury: Teeth and Oropharynx as per pre-operative assessment

## 2023-01-22 NOTE — Transfer of Care (Signed)
Immediate Anesthesia Transfer of Care Note  Patient: Carrie Newman  Procedure(s) Performed: Procedure(s): XI ROBOTIC ASSISTED TOTAL HYSTERECTOMY BILATERAL SALPINGO OOPHORECTOMY WITH OMENTAL BIOPSY (Bilateral) LEFT SENTINEL NODE BIOPSY (N/A) RIGHT PELVIC LYMPHADENECTOMY (N/A)  Patient Location: PACU  Anesthesia Type:General  Level of Consciousness: Patient easily awoken, sedated, comfortable, cooperative, following commands, responds to stimulation.   Airway & Oxygen Therapy: Patient spontaneously breathing, ventilating well, oxygen via simple oxygen mask.  Post-op Assessment: Report given to PACU RN, vital signs reviewed and stable, moving all extremities.   Post vital signs: Reviewed and stable.  Complications: No apparent anesthesia complications  Last Vitals:  Vitals Value Taken Time  BP 101/58 01/22/23 1330  Temp    Pulse 98 01/22/23 1332  Resp 20 01/22/23 1332  SpO2 100 % 01/22/23 1332  Vitals shown include unfiled device data.  Last Pain:  Vitals:   01/22/23 0706  TempSrc:   PainSc: 7          Complications: No notable events documented.

## 2023-01-22 NOTE — Anesthesia Preprocedure Evaluation (Addendum)
Anesthesia Evaluation  Patient identified by MRN, date of birth, ID band Patient awake    Reviewed: Allergy & Precautions, NPO status , Patient's Chart, lab work & pertinent test results  Airway Mallampati: III  TM Distance: >3 FB Neck ROM: Full    Dental no notable dental hx. (+) Teeth Intact, Dental Advisory Given   Pulmonary neg pulmonary ROS   Pulmonary exam normal breath sounds clear to auscultation       Cardiovascular negative cardio ROS Normal cardiovascular exam Rhythm:Regular Rate:Normal     Neuro/Psych negative neurological ROS  negative psych ROS   GI/Hepatic Neg liver ROS, hiatal hernia,GERD  ,,  Endo/Other  negative endocrine ROS    Renal/GU negative Renal ROS  negative genitourinary   Musculoskeletal negative musculoskeletal ROS (+)    Abdominal   Peds  Hematology negative hematology ROS (+)   Anesthesia Other Findings   Reproductive/Obstetrics                             Anesthesia Physical Anesthesia Plan  ASA: 2  Anesthesia Plan: General   Post-op Pain Management: Tylenol PO (pre-op)*, Ketamine IV* and Lidocaine infusion*   Induction: Intravenous  PONV Risk Score and Plan: 3 and Midazolam, Dexamethasone and Ondansetron  Airway Management Planned: Oral ETT  Additional Equipment:   Intra-op Plan:   Post-operative Plan: Extubation in OR  Informed Consent: I have reviewed the patients History and Physical, chart, labs and discussed the procedure including the risks, benefits and alternatives for the proposed anesthesia with the patient or authorized representative who has indicated his/her understanding and acceptance.     Dental advisory given  Plan Discussed with: CRNA  Anesthesia Plan Comments: (2 IVs)       Anesthesia Quick Evaluation

## 2023-01-22 NOTE — Anesthesia Postprocedure Evaluation (Signed)
Anesthesia Post Note  Patient: Carrie Newman  Procedure(s) Performed: XI ROBOTIC ASSISTED TOTAL HYSTERECTOMY BILATERAL SALPINGO OOPHORECTOMY WITH OMENTAL BIOPSY (Bilateral) LEFT SENTINEL NODE BIOPSY RIGHT PELVIC LYMPHADENECTOMY     Patient location during evaluation: PACU Anesthesia Type: General Level of consciousness: awake and alert Pain management: pain level controlled Vital Signs Assessment: post-procedure vital signs reviewed and stable Respiratory status: spontaneous breathing, nonlabored ventilation, respiratory function stable and patient connected to nasal cannula oxygen Cardiovascular status: blood pressure returned to baseline and stable Postop Assessment: no apparent nausea or vomiting Anesthetic complications: no  No notable events documented.  Last Vitals:  Vitals:   01/22/23 1515 01/22/23 1533  BP: 123/69 (!) 116/59  Pulse: 78 74  Resp: 17   Temp:  36.7 C  SpO2: 97% 99%    Last Pain:  Vitals:   01/22/23 1533  TempSrc: Oral  PainSc: Asleep                 Fahmida Jurich L Octavian Godek

## 2023-01-23 ENCOUNTER — Encounter (HOSPITAL_COMMUNITY): Payer: Self-pay | Admitting: Psychiatry

## 2023-01-24 ENCOUNTER — Other Ambulatory Visit: Payer: Self-pay | Admitting: Gynecologic Oncology

## 2023-01-24 ENCOUNTER — Telehealth: Payer: Self-pay | Admitting: *Deleted

## 2023-01-24 DIAGNOSIS — E041 Nontoxic single thyroid nodule: Secondary | ICD-10-CM

## 2023-01-24 LAB — CYTOLOGY - NON PAP

## 2023-01-24 NOTE — Progress Notes (Signed)
Thyroid biopsy ordered due to findings of a nodule on [place].

## 2023-01-24 NOTE — Telephone Encounter (Signed)
Spoke with Ms. Cueva's daughter Byrd Hesselbach this morning. She states her mother is eating, drinking and urinating well. She has had a BM and is passing gas. She is taking senokot as prescribed and encouraged her to drink plenty of water. She denies fever or chills. Incisions are dry and intact. She rates her pain 3/10. Her pain is controlled with tylenol & ibuprofen.    Instructed to call office with any fever, chills, purulent drainage, uncontrolled pain or any other questions or concerns. Patient verbalizes understanding.   Pt aware of post op appointments as well as the office number [phone] and after hours number [phone] to call if she has any questions or concerns  [employer] (spanish) id # [phone] was available for this call.

## 2023-01-25 ENCOUNTER — Telehealth: Payer: Self-pay

## 2023-01-25 NOTE — Telephone Encounter (Signed)
-----   Message from Doylene Bode sent at 01/24/2023  4:16 PM EDT ----- Selena Batten, please call the daughter and let her know on her mother's thyroid US they saw a thyroid nodule and they are recommending a biopsy. The order has been placed and she should be contacted to arrange this.   Clydie Braun if you could stalk this to make sure it gets scheduled, that would be great. Thank you!

## 2023-01-25 NOTE — Telephone Encounter (Signed)
LVM for Byrd Hesselbach, pt's daughter, to call office regarding below message from [facility] APP.

## 2023-01-25 NOTE — Telephone Encounter (Signed)
Carrie Newman, pt's daughter, aware of thyroid ultrasound results. She will wait to hear from someone on getting the biopsy ordered.

## 2023-01-28 ENCOUNTER — Other Ambulatory Visit: Payer: Self-pay | Admitting: Oncology

## 2023-01-28 DIAGNOSIS — R897 Abnormal histological findings in specimens from other organs, systems and tissues: Secondary | ICD-10-CM | POA: Diagnosis not present

## 2023-01-28 LAB — SURGICAL PATHOLOGY

## 2023-01-28 NOTE — Progress Notes (Signed)
Gynecologic Oncology Multi-Disciplinary Disposition Conference Note  Date of the Conference: 01/28/2023  Patient Name: Carrie Newman  Referring Provider: Dr. Briscoe Deutscher Primary GYN Oncologist: Dr. Alvester Morin   Stage/Disposition:  Stage IIC, grade 2 clear cell endometrial cancer. Disposition is to chemotherapy and vaginal brachytherapy.   This Multidisciplinary conference took place involving physicians from Gynecologic Oncology, Medical Oncology, Radiation Oncology, Pathology, Radiology along with the Gynecologic Oncology Nurse Practitioner and Gynecologic Oncology Nurse Navigator.  Comprehensive assessment of the patient's malignancy, staging, need for surgery, chemotherapy, radiation therapy, and need for further testing were reviewed. Supportive measures, both inpatient and following discharge were also discussed. The recommended plan of care is documented. Greater than 35 minutes were spent correlating and coordinating this patient's care.

## 2023-02-04 ENCOUNTER — Encounter: Payer: HMO | Admitting: Psychiatry

## 2023-02-05 ENCOUNTER — Inpatient Hospital Stay: Payer: HMO | Attending: Psychiatry | Admitting: Psychiatry

## 2023-02-05 ENCOUNTER — Telehealth: Payer: Self-pay | Admitting: Oncology

## 2023-02-05 DIAGNOSIS — C541 Malignant neoplasm of endometrium: Secondary | ICD-10-CM | POA: Insufficient documentation

## 2023-02-05 DIAGNOSIS — Z7189 Other specified counseling: Secondary | ICD-10-CM

## 2023-02-05 DIAGNOSIS — Z90710 Acquired absence of both cervix and uterus: Secondary | ICD-10-CM

## 2023-02-05 DIAGNOSIS — Z90722 Acquired absence of ovaries, bilateral: Secondary | ICD-10-CM

## 2023-02-05 NOTE — Progress Notes (Unsigned)
Gynecologic Oncology Telehealth Follow-Up Note  I connected with Carrie Newman on 02/07/23 at  2:30 PM EDT by telephone and verified that I am speaking with the correct person using two identifiers.  I discussed the limitations, risks, security and privacy concerns of performing an evaluation and management service by telemedicine and the availability of in-person appointments. I also discussed with the patient that there may be a patient responsible charge related to this service. The patient expressed understanding and agreed to proceed.  Other persons participating in the visit and their role in the encounter: Spanish Interpreter, Maia Breslow (Daughter)  Patient's location: Home, [place] Provider's location: [facility]  Date of Service: 02/05/2023 Referring Provider: Lorriane Shire, MD   Assessment & Plan: Carrie Newman is a 72 y.o. 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  Postop: - Recovering well - Return next week for in-person visit for exam  Endometrial cancer: - Reviewed pathology results - Reviewed nature of clear cell carcinoma. - Recommend adjuvant treatment with carb/tax, vaginal cuff brachytherapy. - Reviewed typical treatment, side effects. - Pt would like to consider further. Open to discussion with Dr. Bertis Ruddy regarding chemo. - Alternative treatment, discussed EBRT. Reviewed side effect profile compared to chemo/cuff and risk of distant met recurrence. Also reviewed that she could decline adjuvant treatment and pursue close surveillance. But my recommendation would be for adjuvant treatment. - Signs/symptoms of recurrence reviewed.  Thyroid nodule: - Has appt 03/11/23 for biopsy.  RTC for inperson postop visit.  Clide Cliff, MD Gynecologic Oncology   Medical Decision Making I personally spent   TOTAL 23 minutes via telephone visit with patient. The discussion of the diagnosis and management of endometrial cancer is beyond the scope of routine postoperative care.   ----------------------- Reason for Visit: Treatment discussion  Treatment History: Oncology History  Endometrial cancer (HCC)  12/01/2022 Imaging   CT abdomen/pelvis: IMPRESSION: No CT findings to account for the patient's gross hematuria.   3.5 cm fundal lesion, possibly reflecting a noncalcified fibroid versus endometrial thickening. Pelvic ultrasound is suggested for further evaluation.   Cholelithiasis, without associated inflammatory changes.   12/14/2022 Imaging   Pelvic ultrasound: IMPRESSION: Marked abnormal thickening of the endometrium measuring up to 32 mm highly suspicious for malignancy. Endometrial thickness is considered abnormal for an asymptomatic post-menopausal female. Endometrial sampling should be considered to exclude carcinoma.   12/31/2022 Initial Biopsy   Endometrial biopsy: Clear-cell carcinoma COMMENT:  The carcinoma shows patchy positivity with Napsin A and is negative with  P504S, estrogen receptor and progesterone receptor.  Immunohistochemistry for p53 shows wild-type (non mutated) pattern.  The  findings are consistent with endometrial clear cell carcinoma.    12/31/2022 Initial Diagnosis   Endometrial cancer (HCC)   01/08/2023 Imaging   CT Chest:  IMPRESSION: 1. No evidence for metastatic disease in the chest. 2. 2.1 cm left thyroid nodule. Recommend thyroid US (ref: J Am Coll Radiol. 2015 Feb;12(2): 143-50). 3. 1.8 cm benign right adrenal adenoma. No follow-up imaging recommended. 4. Cholelithiasis. 5.  Aortic Atherosclerosis (ICD10-I70.0).   01/22/2023 Cancer Staging   Staging form: Corpus Uteri - Carcinoma and Carcinosarcoma, AJCC 8th Edition - Clinical stage from 01/22/2023: FIGO Stage IA (cT1a, cN0, cM0) - Signed by Clide Cliff, MD on 02/07/2023 Histopathologic  type: Clear cell adenocarcinoma, NOS Stage prefix: Initial diagnosis Histologic grade (G): G3 Histologic grading system:  3 grade system Lymph-vascular invasion (LVI): LVI not present (absent)/not identified   01/22/2023 Surgery   Robotic-assisted total laparoscopic hysterectomy, bilateral salpingo-oophorectomy, bilateral sentinel lymph node evaluation, left sentinel lymph node biopsy, right pelvic and common iliac lymphadenectomy, omental biopsy    01/22/2023 Pathology Results   FINAL MICROSCOPIC DIAGNOSIS:  A. SENTINEL LYMPH NODE, LEFT EXTERNAL ILIAC, EXCISION:      One lymph node, negative for metastatic carcinoma (0/1).  B. UTERUS, CERVIX, BILATERAL FALLOPIAN TUBES AND OVARIES:       Endometrium:       Clear cell adenocarcinoma, NOS.      Carcinoma invades 25% of the full thickness of myometrium (2 mm / 8 mm).      Lymphovascular invasion is not identified.      Pathologic Stage Classification (pTNM, AJCC 8th Edition): pT1a, pN0 FIGO Stage (2023 staging for cancer of the endometrium): IIC      See oncology table.       Other benign findings:  Cervix:           No ectocervical squamous epithelium identified.           Negative for malignancy. Endocervix:           Nabothian cyst.           Negative for hyperplasia, atypia or malignancy.      Endometrium:           Inactive endometrium.      Myometrium:           No other findings.      Serosa:           Unremarkable.           Negative for malignancy.      Bilateral fallopian tubes:           Benign fimbriated fallopian tubes with paratubal cysts.           Negative for malignancy.      Bilateral ovaries:           Benign ovarian parenchyma with inclusion cysts.           Negative for malignancy.  C. LYMPH NODE, RIGHT PELVIC, EXCISION:      Eight lymph nodes, negative for metastatic carcinoma (0/8).  D. LYMPH NODE, RIGHT COMMON ILIAC, EXCISION:      One lymph node, negative for metastatic carcinoma (0/1).  E.  OMENTAL BIOPSY:      Benign mature adipose tissue.      Negative for malignancy.   ONCOLOGY TABLE:  UTERUS, CARCINOMA OR CARCINOSARCOMA: Resection  Procedure: Total hysterectomy and bilateral salpingo-oophorectomy Histologic Type: Clear cell adenocarcinoma, NOS Histologic Grade:  High-grade Myometrial Invasion:      Depth of Myometrial Invasion (mm): 2      Myometrial Thickness (mm): 8      Percentage of Myometrial Invasion: 25% Uterine Serosa Involvement: Not identified Cervical stromal Involvement: Not identified Extent of involvement of other tissue/organs: Not identified Peritoneal/Ascitic Fluid: Not involved Lymphovascular Invasion: Not identified Regional Lymph Nodes:      Pelvic Lymph Nodes Examined: 10          [1] Sentinel          [9] Non-sentinel          [10] Total      Pelvic Lymph Nodes with Metastasis: 0          Macrometastasis: (>2.0 mm): 0          Micrometastasis: (>0.2 mm  and < 2.0 mm): 0          Isolated Tumor Cells (<0.2 mm): 0          Laterality of Lymph Node with Tumor: NA          Extracapsular Extension: NA      Para-aortic Lymph Nodes Examined: 0          [0] Sentinel          [0] Non-sentinel          [0] Total      Para-aortic Lymph Nodes with Metastasis: NA          Macrometastasis: (>2.0 mm): NA          Micrometastasis:  (>0.2 mm and < 2.0 mm): NA          Isolated Tumor Cells (<0.2 mm): NA          Laterality of Lymph Node with Tumor: NA          Extracapsular Extension: NA Distant Metastasis:      Distant Site(s) Involved: Not applicable Pathologic Stage Classification (pTNM, AJCC 8th Edition): pT1a, pN0 FIGO Stage (2023 staging for cancer of the endometrium): IIC Ancillary Studies: MMR / MSI testing will be ordered Representative Tumor Block: B4-B10 Comment(s): None   IHC EXPRESSION RESULTS   TEST           RESULT  MLH1:          Preserved nuclear expression  MSH2:          Preserved nuclear expression  MSH6:           Preserved nuclear expression  PMS2:          Preserved nuclear expression      Interval History: Patient reports she is doing well after surgery.  Almost no pain.  Eating and drinking without issue.  Emptying her bladder normally.  Having regular bowel movements.  No bleeding.  Visit conducted with the assistance of a phone Spanish interpreter.  Patient's daughter also on the line during visit.  Past Medical/Surgical History: Past Medical History:  Diagnosis Date   Cancer (HCC)    GERD (gastroesophageal reflux disease)    History of hiatal hernia    Hyperlipidemia    IBS (irritable bowel syndrome)     Past Surgical History:  Procedure Laterality Date   BREAST BIOPSY Right    x2   CESAREAN SECTION     1978   CESAREAN SECTION     1980   CESAREAN SECTION     1984   COLONOSCOPY     2014   LYMPH NODE DISSECTION N/A 01/22/2023   Procedure: RIGHT PELVIC LYMPHADENECTOMY;  Surgeon: Clide Cliff, MD;  Location: [facility];  Service: Gynecology;  Laterality: N/A;   SENTINEL NODE BIOPSY N/A 01/22/2023   Procedure: LEFT SENTINEL NODE BIOPSY;  Surgeon: Clide Cliff, MD;  Location: [facility];  Service: Gynecology;  Laterality: N/A;    Family History  Problem Relation Age of Onset   Leukemia Brother    Endometrial cancer Niece    Breast cancer Neg Hx    Colon cancer Neg Hx    Ovarian cancer Neg Hx    Pancreatic cancer Neg Hx    Prostate cancer Neg Hx     Social History   Socioeconomic History   Marital status: Married    Spouse name: Not on file   Number of children: Not on file   Years of education: Not  on file   Highest education level: Not on file  Occupational History   Not on file  Tobacco Use   Smoking status: Never    Passive exposure: Never   Smokeless tobacco: Never  Vaping Use   Vaping status: Never Used  Substance and Sexual Activity   Alcohol use: Not Currently   Drug use: Never   Sexual activity: Not Currently  Other Topics Concern   Not on file   Social History Narrative   Not on file   Social Determinants of Health   Financial Resource Strain: Not on file  Food Insecurity: Not on file  Transportation Needs: Not on file  Physical Activity: Not on file  Stress: Not on file  Social Connections: Not on file    Current Medications:  Current Outpatient Medications:    Ascorbic Acid (VITAMIN C PO), Take 1 tablet by mouth daily., Disp: , Rfl:    Cholecalciferol 75 MCG (3000 UT) TABS, Take 3,000 Units by mouth daily., Disp: , Rfl:    cyanocobalamin (VITAMIN B12) 1000 MCG tablet, Take 1,000 mcg by mouth daily., Disp: , Rfl:    senna-docusate (SENOKOT-S) 8.6-50 MG tablet, Take 2 tablets by mouth at bedtime. For AFTER surgery, do not take if having diarrhea, Disp: 30 tablet, Rfl: 0   traMADol (ULTRAM) 50 MG tablet, Take 1 tablet (50 mg total) by mouth every 6 (six) hours as needed for severe pain. For AFTER surgery only, do not take and drive, Disp: 10 tablet, Rfl: 0  Review of Symptoms: Pertinent positives as per HPI.  Physical Exam: Deferred given limitations of phone visit.  Laboratory & Radiologic Studies: Surgical pathology (01/22/23): FINAL MICROSCOPIC DIAGNOSIS:  A. SENTINEL LYMPH NODE, LEFT EXTERNAL ILIAC, EXCISION:      One lymph node, negative for metastatic carcinoma (0/1).  B. UTERUS, CERVIX, BILATERAL FALLOPIAN TUBES AND OVARIES:       Endometrium:       Clear cell adenocarcinoma, NOS.      Carcinoma invades 25% of the full thickness of myometrium (2 mm / 8 mm).      Lymphovascular invasion is not identified.      Pathologic Stage Classification (pTNM, AJCC 8th Edition): pT1a, pN0 FIGO Stage (2023 staging for cancer of the endometrium): IIC      See oncology table.       Other benign findings:  Cervix:           No ectocervical squamous epithelium identified.           Negative for malignancy. Endocervix:           Nabothian cyst.           Negative for hyperplasia, atypia or malignancy.       Endometrium:           Inactive endometrium.      Myometrium:           No other findings.      Serosa:           Unremarkable.           Negative for malignancy.      Bilateral fallopian tubes:           Benign fimbriated fallopian tubes with paratubal cysts.           Negative for malignancy.      Bilateral ovaries:           Benign ovarian parenchyma with inclusion cysts.  Negative for malignancy.  C. LYMPH NODE, RIGHT PELVIC, EXCISION:      Eight lymph nodes, negative for metastatic carcinoma (0/8).  D. LYMPH NODE, RIGHT COMMON ILIAC, EXCISION:      One lymph node, negative for metastatic carcinoma (0/1).  E. OMENTAL BIOPSY:      Benign mature adipose tissue.      Negative for malignancy.   Cytology (01/22/23): FINAL MICROSCOPIC DIAGNOSIS:  - No malignant cells identified

## 2023-02-05 NOTE — Telephone Encounter (Signed)
Called Byrd Hesselbach (daughter) with appointment to see Dr. Bertis Ruddy at the [facility] on 02/14/23 at 2:00 with 1:30 arrival.  She verbalized understanding and agreement.

## 2023-02-06 ENCOUNTER — Ambulatory Visit: Payer: HMO | Admitting: Urology

## 2023-02-07 ENCOUNTER — Encounter: Payer: Self-pay | Admitting: Psychiatry

## 2023-02-07 DIAGNOSIS — C541 Malignant neoplasm of endometrium: Secondary | ICD-10-CM | POA: Insufficient documentation

## 2023-02-11 ENCOUNTER — Inpatient Hospital Stay (HOSPITAL_BASED_OUTPATIENT_CLINIC_OR_DEPARTMENT_OTHER): Payer: HMO | Admitting: Psychiatry

## 2023-02-11 ENCOUNTER — Encounter: Payer: Self-pay | Admitting: Psychiatry

## 2023-02-11 VITALS — BP 133/74 | HR 65 | Temp 98.4°F | Resp 16 | Wt 139.4 lb

## 2023-02-11 DIAGNOSIS — C541 Malignant neoplasm of endometrium: Secondary | ICD-10-CM

## 2023-02-11 NOTE — Progress Notes (Signed)
Gynecologic Oncology Return Clinic Visit  Date of Service: 02/11/2023 Referring Provider: Lorriane Shire, MD   Assessment & Plan: Carrie Newman is a 72 y.o. 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   Postop: - Pt recovering well from surgery and healing appropriately postoperatively - Intraoperative findings and pathology results reviewed. - Ongoing postoperative expectations and precautions reviewed. Continue with no lifting >10lbs through 6 weeks postoperatively - Return in 3-4 weeks for cuff check given friability noted.  Endometrial cancer: - Had recommended adjuvant treatment with carb/tax, vaginal cuff brachytherapy. Reviewed this recommendation again. Alternative, adjuvant treatment with EBRT. - Pt declines adjuvant treatment. Reports knowing individuals with bad experiences with chemotherapy.  Reviewed that this treatment may be different than others (like her brother had leukemia).  However, patient feels comfortable with this decision and declines adjuvant treatment. - Signs/symptoms of recurrence reviewed. - Recommend follow-up q51mo initially - May consider CT in 62mo given high risk histology and declined adjuvant treatment.   Thyroid nodule: - Has appt 03/11/23 for biopsy.   RTC 3-4 weeks for cuff check.  Clide Cliff, MD Gynecologic Oncology    ----------------------- Reason for Visit: Postop/Treatment counseling  Treatment History: Oncology History  Endometrial cancer [facility])  12/01/2022 Imaging   CT abdomen/pelvis: IMPRESSION: No CT findings to account for the patient's gross hematuria.   3.5 cm fundal lesion, possibly reflecting a noncalcified fibroid versus endometrial thickening. Pelvic ultrasound is suggested for further evaluation.   Cholelithiasis, without associated  inflammatory changes.   12/14/2022 Imaging   Pelvic ultrasound: IMPRESSION: Marked abnormal thickening of the endometrium measuring up to 32 mm highly suspicious for malignancy. Endometrial thickness is considered abnormal for an asymptomatic post-menopausal female. Endometrial sampling should be considered to exclude carcinoma.   12/31/2022 Initial Biopsy   Endometrial biopsy: Clear-cell carcinoma COMMENT:  The carcinoma shows patchy positivity with Napsin A and is negative with  P504S, estrogen receptor and progesterone receptor.  Immunohistochemistry for p53 shows wild-type (non mutated) pattern.  The  findings are consistent with endometrial clear cell carcinoma.    12/31/2022 Initial Diagnosis   Endometrial cancer (HCC)   01/08/2023 Imaging   CT Chest:  IMPRESSION: 1. No evidence for metastatic disease in the chest. 2. 2.1 cm left thyroid nodule. Recommend thyroid US (ref: J Am Coll Radiol. 2015 Feb;12(2): 143-50). 3. 1.8 cm benign right adrenal adenoma. No follow-up imaging recommended. 4. Cholelithiasis. 5.  Aortic Atherosclerosis (ICD10-I70.0).   01/22/2023 Cancer Staging   Staging form: Corpus Uteri - Carcinoma and Carcinosarcoma, AJCC 8th Edition - Clinical stage from 01/22/2023: FIGO Stage IA (cT1a, cN0, cM0) - Signed by Clide Cliff, MD on 02/07/2023 Histopathologic type: Clear cell adenocarcinoma, NOS Stage prefix: Initial diagnosis Histologic grade (G): G3 Histologic grading system: 3 grade system Lymph-vascular invasion (LVI): LVI not present (absent)/not identified   01/22/2023 Surgery   Robotic-assisted total laparoscopic hysterectomy, bilateral salpingo-oophorectomy, bilateral sentinel lymph node evaluation, left sentinel lymph node biopsy, right pelvic and common iliac lymphadenectomy, omental biopsy    01/22/2023 Pathology Results   FINAL MICROSCOPIC DIAGNOSIS:  A. SENTINEL LYMPH NODE, LEFT EXTERNAL ILIAC, EXCISION:      One lymph node, negative for  metastatic carcinoma (0/1).  B. UTERUS, CERVIX, BILATERAL FALLOPIAN TUBES AND OVARIES:       Endometrium:       Clear cell adenocarcinoma, NOS.  Carcinoma invades 25% of the full thickness of myometrium (2 mm / 8 mm).      Lymphovascular invasion is not identified.      Pathologic Stage Classification (pTNM, AJCC 8th Edition): pT1a, pN0 FIGO Stage (2023 staging for cancer of the endometrium): IIC      See oncology table.       Other benign findings:  Cervix:           No ectocervical squamous epithelium identified.           Negative for malignancy. Endocervix:           Nabothian cyst.           Negative for hyperplasia, atypia or malignancy.      Endometrium:           Inactive endometrium.      Myometrium:           No other findings.      Serosa:           Unremarkable.           Negative for malignancy.      Bilateral fallopian tubes:           Benign fimbriated fallopian tubes with paratubal cysts.           Negative for malignancy.      Bilateral ovaries:           Benign ovarian parenchyma with inclusion cysts.           Negative for malignancy.  C. LYMPH NODE, RIGHT PELVIC, EXCISION:      Eight lymph nodes, negative for metastatic carcinoma (0/8).  D. LYMPH NODE, RIGHT COMMON ILIAC, EXCISION:      One lymph node, negative for metastatic carcinoma (0/1).  E. OMENTAL BIOPSY:      Benign mature adipose tissue.      Negative for malignancy.   ONCOLOGY TABLE:  UTERUS, CARCINOMA OR CARCINOSARCOMA: Resection  Procedure: Total hysterectomy and bilateral salpingo-oophorectomy Histologic Type: Clear cell adenocarcinoma, NOS Histologic Grade:  High-grade Myometrial Invasion:      Depth of Myometrial Invasion (mm): 2      Myometrial Thickness (mm): 8      Percentage of Myometrial Invasion: 25% Uterine Serosa Involvement: Not identified Cervical stromal Involvement: Not identified Extent of involvement of other tissue/organs: Not  identified Peritoneal/Ascitic Fluid: Not involved Lymphovascular Invasion: Not identified Regional Lymph Nodes:      Pelvic Lymph Nodes Examined: 10          [1] Sentinel          [9] Non-sentinel          [10] Total      Pelvic Lymph Nodes with Metastasis: 0          Macrometastasis: (>2.0 mm): 0          Micrometastasis: (>0.2 mm and < 2.0 mm): 0          Isolated Tumor Cells (<0.2 mm): 0          Laterality of Lymph Node with Tumor: NA          Extracapsular Extension: NA      Para-aortic Lymph Nodes Examined: 0          [0] Sentinel          [0] Non-sentinel          [0] Total      Para-aortic Lymph Nodes with Metastasis: NA  Macrometastasis: (>2.0 mm): NA          Micrometastasis:  (>0.2 mm and < 2.0 mm): NA          Isolated Tumor Cells (<0.2 mm): NA          Laterality of Lymph Node with Tumor: NA          Extracapsular Extension: NA Distant Metastasis:      Distant Site(s) Involved: Not applicable Pathologic Stage Classification (pTNM, AJCC 8th Edition): pT1a, pN0 FIGO Stage (2023 staging for cancer of the endometrium): IIC Ancillary Studies: MMR / MSI testing will be ordered Representative Tumor Block: B4-B10 Comment(s): None   IHC EXPRESSION RESULTS   TEST           RESULT  MLH1:          Preserved nuclear expression  MSH2:          Preserved nuclear expression  MSH6:          Preserved nuclear expression  PMS2:          Preserved nuclear expression      Interval History: Pt reports that she is recovering well from surgery. She is not having much pain. She is eating and drinking well. She is voiding without issue and having regular bowel movements. No bleeding.   Past Medical/Surgical History: Past Medical History:  Diagnosis Date   Cancer (HCC)    GERD (gastroesophageal reflux disease)    History of hiatal hernia    Hyperlipidemia    IBS (irritable bowel syndrome)     Past Surgical History:  Procedure Laterality Date   BREAST BIOPSY Right     x2   CESAREAN SECTION     1978   CESAREAN SECTION     1980   CESAREAN SECTION     1984   COLONOSCOPY     2014   LYMPH NODE DISSECTION N/A 01/22/2023   Procedure: RIGHT PELVIC LYMPHADENECTOMY;  Surgeon: Clide Cliff, MD;  Location: [facility];  Service: Gynecology;  Laterality: N/A;   SENTINEL NODE BIOPSY N/A 01/22/2023   Procedure: LEFT SENTINEL NODE BIOPSY;  Surgeon: Clide Cliff, MD;  Location: [facility];  Service: Gynecology;  Laterality: N/A;    Family History  Problem Relation Age of Onset   Leukemia Brother    Endometrial cancer Niece    Breast cancer Neg Hx    Colon cancer Neg Hx    Ovarian cancer Neg Hx    Pancreatic cancer Neg Hx    Prostate cancer Neg Hx     Social History   Socioeconomic History   Marital status: Married    Spouse name: Not on file   Number of children: Not on file   Years of education: Not on file   Highest education level: Not on file  Occupational History   Not on file  Tobacco Use   Smoking status: Never    Passive exposure: Never   Smokeless tobacco: Never  Vaping Use   Vaping status: Never Used  Substance and Sexual Activity   Alcohol use: Not Currently   Drug use: Never   Sexual activity: Not Currently  Other Topics Concern   Not on file  Social History Narrative   Not on file   Social Determinants of Health   Financial Resource Strain: Not on file  Food Insecurity: Not on file  Transportation Needs: Not on file  Physical Activity: Not on file  Stress: Not on file  Social Connections: Not on  file    Current Medications:  Current Outpatient Medications:    Ascorbic Acid (VITAMIN C PO), Take 1 tablet by mouth daily., Disp: , Rfl:    Cholecalciferol 75 MCG (3000 UT) TABS, Take 3,000 Units by mouth daily., Disp: , Rfl:    cyanocobalamin (VITAMIN B12) 1000 MCG tablet, Take 1,000 mcg by mouth daily., Disp: , Rfl:    senna-docusate (SENOKOT-S) 8.6-50 MG tablet, Take 2 tablets by mouth at bedtime. For AFTER surgery, do not  take if having diarrhea, Disp: 30 tablet, Rfl: 0   traMADol (ULTRAM) 50 MG tablet, Take 1 tablet (50 mg total) by mouth every 6 (six) hours as needed for severe pain. For AFTER surgery only, do not take and drive, Disp: 10 tablet, Rfl: 0  Review of Symptoms: Complete 10-system review is negative except as above in Interval History.  Physical Exam: BP 133/74 (BP Location: Left Arm, Patient Position: Sitting)   Pulse 65   Temp 98.4 F (36.9 C) (Oral)   Resp 16   Wt 139 lb 6.4 oz (63.2 kg)   SpO2 98%   BMI 25.50 kg/m  General: Alert, oriented, no acute distress. HEENT: Normocephalic, atraumatic. Neck symmetric without masses. Sclera anicteric.  Chest: Normal work of breathing. Clear to auscultation bilaterally.   Cardiovascular: Regular rate and rhythm, no murmurs. Abdomen: Soft, nontender.  Normoactive bowel sounds.  No masses appreciated.  Well-healing incisions with glue. Extremities: Grossly normal range of motion.  Warm, well perfused.  No edema bilaterally. Skin: No rashes or lesions noted. GU: Normal appearing external genitalia without erythema, excoriation, or lesions.  Speculum exam reveals raw vaginal cuff with suture but intact, no active bleeding but friable. No discharge or drainage.  Bimanual exam reveals intact vaginal cuff. No fluctuance. Exam chaperoned by Andrey Cota, RN   Laboratory & Radiologic Studies: Surgical pathology (01/22/23): FINAL MICROSCOPIC DIAGNOSIS:  A. SENTINEL LYMPH NODE, LEFT EXTERNAL ILIAC, EXCISION:      One lymph node, negative for metastatic carcinoma (0/1).  B. UTERUS, CERVIX, BILATERAL FALLOPIAN TUBES AND OVARIES:       Endometrium:       Clear cell adenocarcinoma, NOS.      Carcinoma invades 25% of the full thickness of myometrium (2 mm / 8 mm).      Lymphovascular invasion is not identified.      Pathologic Stage Classification (pTNM, AJCC 8th Edition): pT1a, pN0 FIGO Stage (2023 staging for cancer of the endometrium): IIC      See  oncology table.       Other benign findings:  Cervix:           No ectocervical squamous epithelium identified.           Negative for malignancy. Endocervix:           Nabothian cyst.           Negative for hyperplasia, atypia or malignancy.      Endometrium:           Inactive endometrium.      Myometrium:           No other findings.      Serosa:           Unremarkable.           Negative for malignancy.      Bilateral fallopian tubes:           Benign fimbriated fallopian tubes with paratubal cysts.  Negative for malignancy.      Bilateral ovaries:           Benign ovarian parenchyma with inclusion cysts.           Negative for malignancy.  C. LYMPH NODE, RIGHT PELVIC, EXCISION:      Eight lymph nodes, negative for metastatic carcinoma (0/8).  D. LYMPH NODE, RIGHT COMMON ILIAC, EXCISION:      One lymph node, negative for metastatic carcinoma (0/1).  E. OMENTAL BIOPSY:      Benign mature adipose tissue.      Negative for malignancy.   ONCOLOGY TABLE:  UTERUS, CARCINOMA OR CARCINOSARCOMA: Resection  Procedure: Total hysterectomy and bilateral salpingo-oophorectomy Histologic Type: Clear cell adenocarcinoma, NOS Histologic Grade:  High-grade Myometrial Invasion:      Depth of Myometrial Invasion (mm): 2      Myometrial Thickness (mm): 8      Percentage of Myometrial Invasion: 25% Uterine Serosa Involvement: Not identified Cervical stromal Involvement: Not identified Extent of involvement of other tissue/organs: Not identified Peritoneal/Ascitic Fluid: Not involved Lymphovascular Invasion: Not identified Regional Lymph Nodes:      Pelvic Lymph Nodes Examined: 10          [1] Sentinel          [9] Non-sentinel          [10] Total      Pelvic Lymph Nodes with Metastasis: 0          Macrometastasis: (>2.0 mm): 0          Micrometastasis: (>0.2 mm and < 2.0 mm): 0          Isolated Tumor Cells (<0.2 mm): 0          Laterality of Lymph Node with Tumor:  NA          Extracapsular Extension: NA      Para-aortic Lymph Nodes Examined: 0          [0] Sentinel          [0] Non-sentinel          [0] Total      Para-aortic Lymph Nodes with Metastasis: NA          Macrometastasis: (>2.0 mm): NA          Micrometastasis:  (>0.2 mm and < 2.0 mm): NA          Isolated Tumor Cells (<0.2 mm): NA          Laterality of Lymph Node with Tumor: NA          Extracapsular Extension: NA Distant Metastasis:      Distant Site(s) Involved: Not applicable Pathologic Stage Classification (pTNM, AJCC 8th Edition): pT1a, pN0 FIGO Stage (2023 staging for cancer of the endometrium): IIC Ancillary Studies: MMR / MSI testing will be ordered Representative Tumor Block: B4-B10 Comment(s): None

## 2023-02-11 NOTE — Patient Instructions (Signed)
It was a pleasure to see you in clinic today. - Return visit planned for 3-4 weeks to recheck healing.  Thank you very much for allowing me to provide care for you today.  I appreciate your confidence in choosing our Gynecologic Oncology team at [facility].  If you have any questions about your visit today please call our office or send [place] a MyChart message and we will get back to you as soon as possible.

## 2023-02-14 ENCOUNTER — Ambulatory Visit: Payer: HMO | Admitting: Hematology and Oncology

## 2023-02-20 NOTE — Discharge Summary (Signed)
Physician Discharge Summary  Patient ID: Carrie Newman MRN: [id number] DOB/AGE: [birth year] 72 y.o.  Admit date: 01/22/2023 Discharge date: 01/22/23  Admission Diagnoses: <principal problem not specified>  Discharge Diagnoses:  Active Problems:   * No active hospital problems. *   Discharged Condition: good  Hospital Course: On 01/22/2023, the patient underwent the following: Procedure(s): XI ROBOTIC ASSISTED TOTAL HYSTERECTOMY BILATERAL SALPINGO OOPHORECTOMY WITH OMENTAL BIOPSY LEFT SENTINEL NODE BIOPSY RIGHT PELVIC LYMPHADENECTOMY.   The postoperative course was uneventful.  She was discharged to home on postoperative day 0 tolerating a regular diet.   Consults: None  Significant Diagnostic Studies: None  Treatments: surgery:   Discharge Exam: Blood pressure (!) 116/59, pulse 74, temperature 98.1 F (36.7 C), temperature source Oral, resp. rate 17, height 5\' 2"  (1.575 m), weight 63.5 kg, SpO2 99%. Deferred  Disposition: Discharge disposition: 01-Home or Self Care       Discharge Instructions     Call MD for:  difficulty breathing, headache or visual disturbances   Complete by: As directed    Call MD for:  extreme fatigue   Complete by: As directed    Call MD for:  persistant dizziness or light-headedness   Complete by: As directed    Call MD for:  persistant nausea and vomiting   Complete by: As directed    Call MD for:  redness, tenderness, or signs of infection (pain, swelling, redness, odor or green/yellow discharge around incision site)   Complete by: As directed    Call MD for:  severe uncontrolled pain   Complete by: As directed    Call MD for:  temperature >100.4   Complete by: As directed    Diet general   Complete by: As directed       Allergies as of 01/22/2023   No Known Allergies      Medication List     STOP taking these medications    megestrol 40 MG tablet Commonly known as: MEGACE       TAKE these medications    Cholecalciferol 75  MCG (3000 UT) Tabs Take 3,000 Units by mouth daily.   cyanocobalamin 1000 MCG tablet Commonly known as: VITAMIN B12 Take 1,000 mcg by mouth daily.   senna-docusate 8.6-50 MG tablet Commonly known as: Senokot-S Take 2 tablets by mouth at bedtime. For AFTER surgery, do not take if having diarrhea   traMADol 50 MG tablet Commonly known as: ULTRAM Take 1 tablet (50 mg total) by mouth every 6 (six) hours as needed for severe pain. For AFTER surgery only, do not take and drive   VITAMIN C PO Take 1 tablet by mouth daily.        Follow-up Information     Clide Cliff, MD Follow up on 02/11/2023.   Specialty: Gynecologic Oncology Why: at 1:30 pm at the [facility] information: [address] [place] [address] [phone]                 Signed: Antionette Char 02/20/2023, 12:51 PM

## 2023-03-01 IMAGING — MG DIGITAL SCREENING BILAT W/ CAD
4 series · 4 of 4 positions shown · non-contrast
Comparison: Previous exam(s).

CLINICAL DATA: Screening.

EXAM:
DIGITAL SCREENING BILATERAL MAMMOGRAM WITH CAD
TECHNIQUE: Bilateral screening digital craniocaudal and mediolateral oblique
mammograms were obtained. The images were evaluated with
computer-aided detection.

[L MLO]
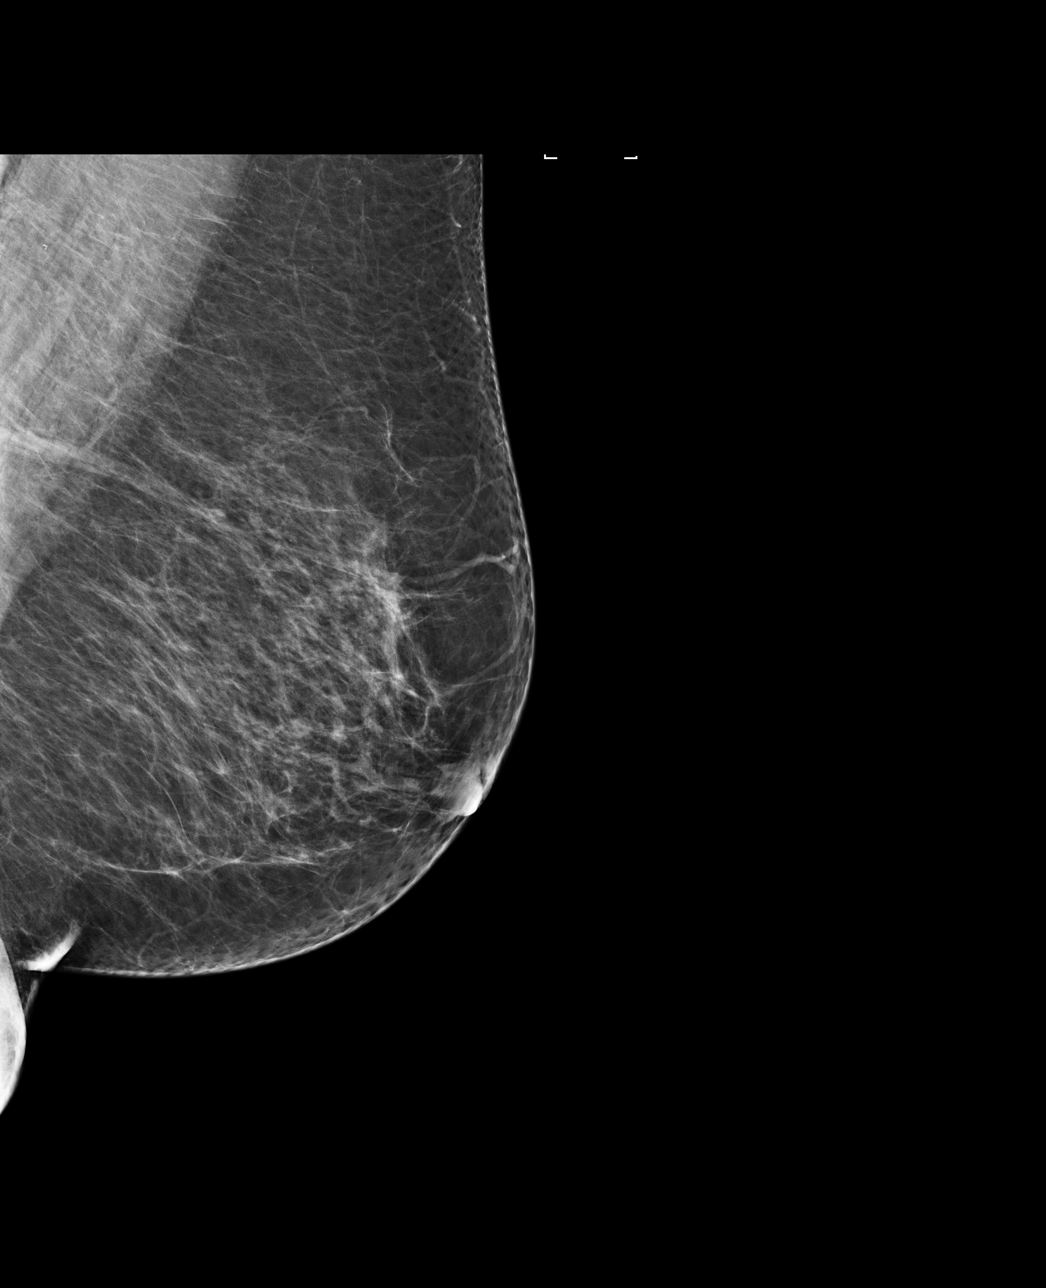

[R MLO]
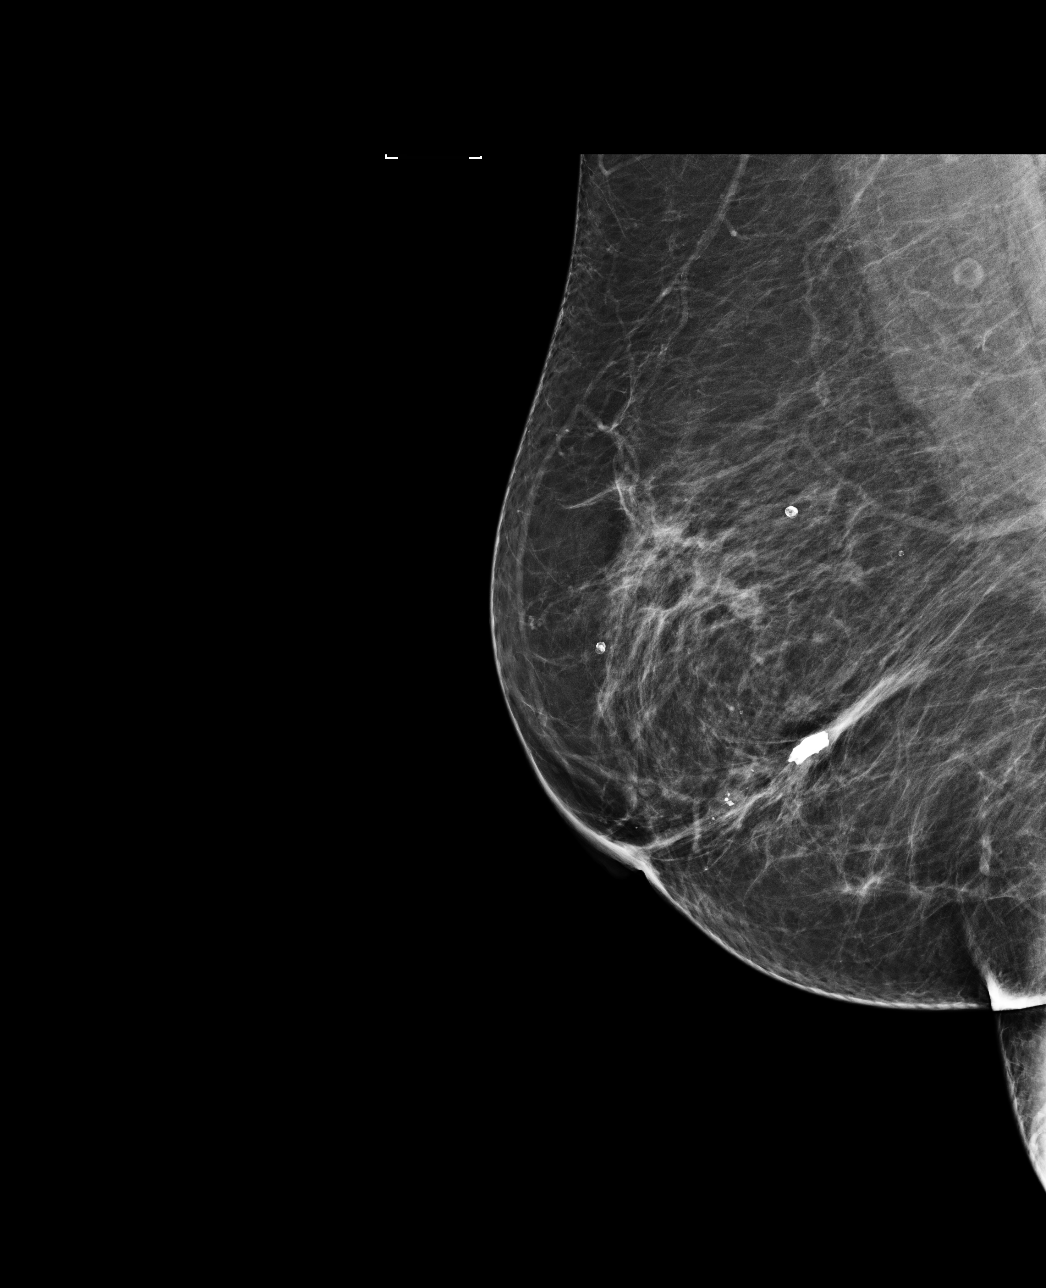

[L CC]
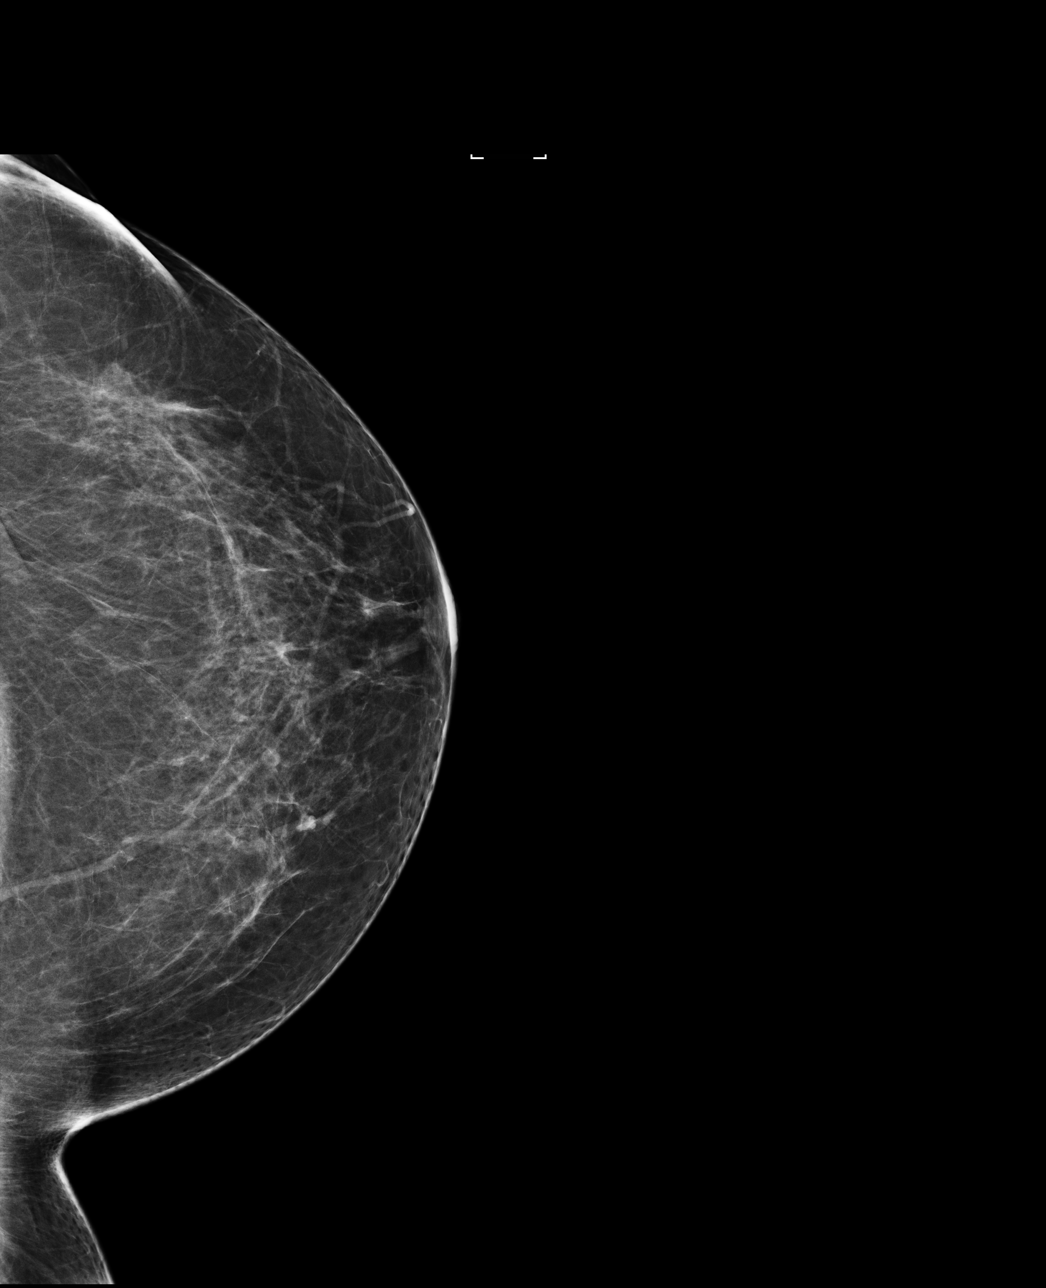

[R CC]
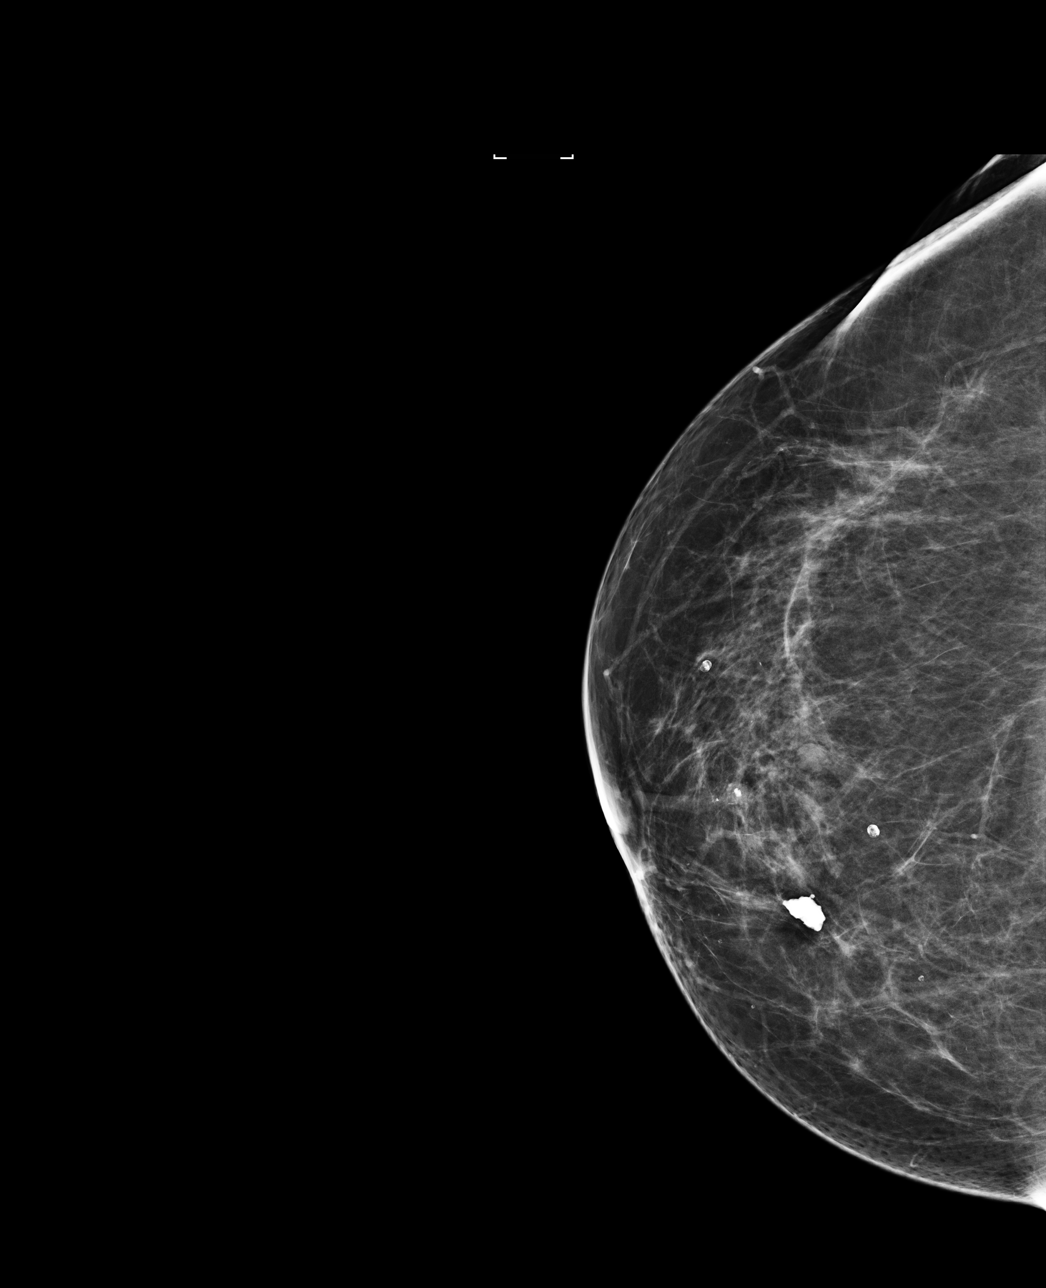

[4 of 4 positions shown; findings below may reference images not displayed]

ACR Breast Density Category b: There are scattered areas of
fibroglandular density.
FINDINGS: There are no findings suspicious for malignancy. The images were
evaluated with computer-aided detection.
IMPRESSION: No mammographic evidence of malignancy. A result letter of this
screening mammogram will be mailed directly to the patient.

RECOMMENDATION:
Screening mammogram in one year. (Code:[id number]-[id number])

BI-RADS CATEGORY  1: Negative.

## 2023-03-11 ENCOUNTER — Telehealth: Payer: Self-pay | Admitting: Oncology

## 2023-03-11 ENCOUNTER — Inpatient Hospital Stay: Payer: HMO | Attending: Psychiatry | Admitting: Psychiatry

## 2023-03-11 ENCOUNTER — Other Ambulatory Visit: Payer: HMO

## 2023-03-11 ENCOUNTER — Encounter: Payer: Self-pay | Admitting: Psychiatry

## 2023-03-11 VITALS — BP 123/65 | HR 68 | Temp 98.3°F | Resp 20 | Wt 137.0 lb

## 2023-03-11 DIAGNOSIS — C541 Malignant neoplasm of endometrium: Secondary | ICD-10-CM

## 2023-03-11 NOTE — Progress Notes (Signed)
Gynecologic Oncology Return Clinic Visit  Date of Service: 03/11/2023 Referring Provider: Lorriane Shire, MD   Assessment & Plan: Carrie Newman is a 72 y.o. 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   Postop: - Pt recovering well from surgery and healing appropriately postoperatively. - Improved cuff healing - Nothing in vagina until 12 weeks postop. Okay to walk on treadmill  Endometrial cancer: - Had recommended adjuvant treatment with carb/tax, vaginal cuff brachytherapy. Alternative, adjuvant treatment with EBRT. - Pt declines adjuvant treatment. Reports knowing individuals with bad experiences with chemotherapy.  Reviewed that this treatment may be different than others (like her brother had leukemia).  However, patient feels comfortable with this decision and declines adjuvant treatment. - Signs/symptoms of recurrence reviewed. - Recommend follow-up q67mo initially - May consider CT in 51mo given high risk histology and declined adjuvant treatment. Also reviewed imaging for symptoms, exam only. Pt will consider and let me know at next visit.   Thyroid nodule: - Had appt 03/11/23 for biopsy. Rescheduled due to today's visit.   RTC 95mo  Clide Cliff, MD Gynecologic Oncology    ----------------------- Reason for Visit: Postop follow-up  Treatment History: Oncology History  Endometrial cancer (HCC)  12/01/2022 Imaging   CT abdomen/pelvis: IMPRESSION: No CT findings to account for the patient's gross hematuria.   3.5 cm fundal lesion, possibly reflecting a noncalcified fibroid versus endometrial thickening. Pelvic ultrasound is suggested for further evaluation.   Cholelithiasis, without associated inflammatory changes.   12/14/2022 Imaging   Pelvic ultrasound: IMPRESSION: Marked abnormal  thickening of the endometrium measuring up to 32 mm highly suspicious for malignancy. Endometrial thickness is considered abnormal for an asymptomatic post-menopausal female. Endometrial sampling should be considered to exclude carcinoma.   12/31/2022 Initial Biopsy   Endometrial biopsy: Clear-cell carcinoma COMMENT:  The carcinoma shows patchy positivity with Napsin A and is negative with  P504S, estrogen receptor and progesterone receptor.  Immunohistochemistry for p53 shows wild-type (non mutated) pattern.  The  findings are consistent with endometrial clear cell carcinoma.    12/31/2022 Initial Diagnosis   Endometrial cancer (HCC)   01/08/2023 Imaging   CT Chest:  IMPRESSION: 1. No evidence for metastatic disease in the chest. 2. 2.1 cm left thyroid nodule. Recommend thyroid US (ref: J Am Coll Radiol. 2015 Feb;12(2): 143-50). 3. 1.8 cm benign right adrenal adenoma. No follow-up imaging recommended. 4. Cholelithiasis. 5.  Aortic Atherosclerosis (ICD10-I70.0).   01/22/2023 Cancer Staging   Staging form: Corpus Uteri - Carcinoma and Carcinosarcoma, AJCC 8th Edition - Clinical stage from 01/22/2023: FIGO Stage IA (cT1a, cN0, cM0) - Signed by Clide Cliff, MD on 02/07/2023 Histopathologic type: Clear cell adenocarcinoma, NOS Stage prefix: Initial diagnosis Histologic grade (G): G3 Histologic grading system: 3 grade system Lymph-vascular invasion (LVI): LVI not present (absent)/not identified   01/22/2023 Surgery   Robotic-assisted total laparoscopic hysterectomy, bilateral salpingo-oophorectomy, bilateral sentinel lymph node evaluation, left sentinel lymph node biopsy, right pelvic and common iliac lymphadenectomy, omental biopsy    01/22/2023 Pathology Results   FINAL MICROSCOPIC DIAGNOSIS:  A. SENTINEL LYMPH NODE, LEFT EXTERNAL ILIAC, EXCISION:      One lymph node, negative for metastatic carcinoma (0/1).  B. UTERUS, CERVIX, BILATERAL FALLOPIAN TUBES AND OVARIES:        Endometrium:       Clear cell adenocarcinoma, NOS.      Carcinoma  invades 25% of the full thickness of myometrium (2 mm / 8 mm).      Lymphovascular invasion is not identified.      Pathologic Stage Classification (pTNM, AJCC 8th Edition): pT1a, pN0 FIGO Stage (2023 staging for cancer of the endometrium): IIC      See oncology table.       Other benign findings:  Cervix:           No ectocervical squamous epithelium identified.           Negative for malignancy. Endocervix:           Nabothian cyst.           Negative for hyperplasia, atypia or malignancy.      Endometrium:           Inactive endometrium.      Myometrium:           No other findings.      Serosa:           Unremarkable.           Negative for malignancy.      Bilateral fallopian tubes:           Benign fimbriated fallopian tubes with paratubal cysts.           Negative for malignancy.      Bilateral ovaries:           Benign ovarian parenchyma with inclusion cysts.           Negative for malignancy.  C. LYMPH NODE, RIGHT PELVIC, EXCISION:      Eight lymph nodes, negative for metastatic carcinoma (0/8).  D. LYMPH NODE, RIGHT COMMON ILIAC, EXCISION:      One lymph node, negative for metastatic carcinoma (0/1).  E. OMENTAL BIOPSY:      Benign mature adipose tissue.      Negative for malignancy.   ONCOLOGY TABLE:  UTERUS, CARCINOMA OR CARCINOSARCOMA: Resection  Procedure: Total hysterectomy and bilateral salpingo-oophorectomy Histologic Type: Clear cell adenocarcinoma, NOS Histologic Grade:  High-grade Myometrial Invasion:      Depth of Myometrial Invasion (mm): 2      Myometrial Thickness (mm): 8      Percentage of Myometrial Invasion: 25% Uterine Serosa Involvement: Not identified Cervical stromal Involvement: Not identified Extent of involvement of other tissue/organs: Not identified Peritoneal/Ascitic Fluid: Not involved Lymphovascular Invasion: Not identified Regional Lymph Nodes:       Pelvic Lymph Nodes Examined: 10          [1] Sentinel          [9] Non-sentinel          [10] Total      Pelvic Lymph Nodes with Metastasis: 0          Macrometastasis: (>2.0 mm): 0          Micrometastasis: (>0.2 mm and < 2.0 mm): 0          Isolated Tumor Cells (<0.2 mm): 0          Laterality of Lymph Node with Tumor: NA          Extracapsular Extension: NA      Para-aortic Lymph Nodes Examined: 0          [0] Sentinel          [0] Non-sentinel          [0] Total      Para-aortic Lymph Nodes with Metastasis: NA  Macrometastasis: (>2.0 mm): NA          Micrometastasis:  (>0.2 mm and < 2.0 mm): NA          Isolated Tumor Cells (<0.2 mm): NA          Laterality of Lymph Node with Tumor: NA          Extracapsular Extension: NA Distant Metastasis:      Distant Site(s) Involved: Not applicable Pathologic Stage Classification (pTNM, AJCC 8th Edition): pT1a, pN0 FIGO Stage (2023 staging for cancer of the endometrium): IIC Ancillary Studies: MMR / MSI testing will be ordered Representative Tumor Block: B4-B10 Comment(s): None   IHC EXPRESSION RESULTS   TEST           RESULT  MLH1:          Preserved nuclear expression  MSH2:          Preserved nuclear expression  MSH6:          Preserved nuclear expression  PMS2:          Preserved nuclear expression      Interval History: Patient presents with her daughter.  She reports doing well.  Denies pain or bleeding since her last follow-up.     Past Medical/Surgical History: Past Medical History:  Diagnosis Date   Cancer (HCC)    GERD (gastroesophageal reflux disease)    History of hiatal hernia    Hyperlipidemia    IBS (irritable bowel syndrome)     Past Surgical History:  Procedure Laterality Date   BREAST BIOPSY Right    x2   CESAREAN SECTION     1978   CESAREAN SECTION     1980   CESAREAN SECTION     1984   COLONOSCOPY     2014   LYMPH NODE DISSECTION N/A 01/22/2023   Procedure: RIGHT PELVIC  LYMPHADENECTOMY;  Surgeon: Clide Cliff, MD;  Location: [facility];  Service: Gynecology;  Laterality: N/A;   SENTINEL NODE BIOPSY N/A 01/22/2023   Procedure: LEFT SENTINEL NODE BIOPSY;  Surgeon: Clide Cliff, MD;  Location: [facility];  Service: Gynecology;  Laterality: N/A;    Family History  Problem Relation Age of Onset   Leukemia Brother    Endometrial cancer Niece    Breast cancer Neg Hx    Colon cancer Neg Hx    Ovarian cancer Neg Hx    Pancreatic cancer Neg Hx    Prostate cancer Neg Hx     Social History   Socioeconomic History   Marital status: Married    Spouse name: Not on file   Number of children: Not on file   Years of education: Not on file   Highest education level: Not on file  Occupational History   Not on file  Tobacco Use   Smoking status: Never    Passive exposure: Never   Smokeless tobacco: Never  Vaping Use   Vaping status: Never Used  Substance and Sexual Activity   Alcohol use: Not Currently   Drug use: Never   Sexual activity: Not Currently  Other Topics Concern   Not on file  Social History Narrative   Not on file   Social Determinants of Health   Financial Resource Strain: Not on file  Food Insecurity: Not on file  Transportation Needs: Not on file  Physical Activity: Not on file  Stress: Not on file  Social Connections: Not on file    Current Medications:  Current Outpatient Medications:  Ascorbic Acid (VITAMIN C PO), Take 1 tablet by mouth daily., Disp: , Rfl:    Cholecalciferol 75 MCG (3000 UT) TABS, Take 3,000 Units by mouth daily., Disp: , Rfl:    cyanocobalamin (VITAMIN B12) 1000 MCG tablet, Take 1,000 mcg by mouth daily., Disp: , Rfl:   Review of Symptoms: Complete 10-system review is positive for: Dizziness, itching  Physical Exam: BP 123/65 (BP Location: Left Arm, Patient Position: Sitting)   Pulse 68   Temp 98.3 F (36.8 C) (Oral)   Resp 20   Wt 137 lb (62.1 kg)   SpO2 99%   BMI 25.06 kg/m  General: Alert,  oriented, no acute distress. HEENT: Normocephalic, atraumatic. Neck symmetric without masses. Sclera anicteric.  Chest: Normal work of breathing. Clear to auscultation bilaterally.   Cardiovascular: Regular rate and rhythm, no murmurs. Abdomen: Soft, nontender.  Normoactive bowel sounds.  No masses appreciated.  Well-healing incisions with glue. Extremities: Grossly normal range of motion.  Warm, well perfused.  No edema bilaterally. Skin: No rashes or lesions noted. GU: Normal appearing external genitalia without erythema, excoriation, or lesions.  Speculum exam reveals intact vaginal cuff with improved healing.  No friability or active bleeding. No discharge or drainage.  Bimanual exam reveals intact vaginal cuff. No fluctuance. Exam chaperoned by Kimberly [place], CMA   Laboratory & Radiologic Studies: None

## 2023-03-11 NOTE — Telephone Encounter (Signed)
Called Carrie Newman and advised that the thyroid biopsy for Carrie Newman has been moved up to 03/21/23 at 2:15 at the [facility] location.  Gave her the address and instructions listed on the appointment.  She verbalized understanding and agreement.

## 2023-03-11 NOTE — Patient Instructions (Signed)
It was a pleasure to see you in clinic today. - Okay to walk on treadmill - Nothing in vagina until 12 weeks postop - Return visit planned for 3 months  Thank you very much for allowing me to provide care for you today.  I appreciate your confidence in choosing our Gynecologic Oncology team at [facility].  If you have any questions about your visit today please call our office or send [place] a MyChart message and we will get back to you as soon as possible.

## 2023-03-12 ENCOUNTER — Encounter: Payer: Self-pay | Admitting: Family Medicine

## 2023-03-12 DIAGNOSIS — Z1231 Encounter for screening mammogram for malignant neoplasm of breast: Secondary | ICD-10-CM

## 2023-03-13 ENCOUNTER — Other Ambulatory Visit: Payer: Self-pay | Admitting: Family Medicine

## 2023-03-13 DIAGNOSIS — Z1231 Encounter for screening mammogram for malignant neoplasm of breast: Secondary | ICD-10-CM

## 2023-03-21 ENCOUNTER — Ambulatory Visit
Admission: RE | Admit: 2023-03-21 | Discharge: 2023-03-21 | Disposition: A | Discharge status: Home / Self Care | Payer: HMO | Source: Ambulatory Visit | Attending: Gynecologic Oncology | Admitting: Gynecologic Oncology

## 2023-03-21 ENCOUNTER — Other Ambulatory Visit (HOSPITAL_COMMUNITY)
Admission: RE | Admit: 2023-03-21 | Discharge: 2023-03-21 | Disposition: A | Discharge status: Home / Self Care | Payer: HMO | Source: Ambulatory Visit | Attending: Gynecologic Oncology | Admitting: Gynecologic Oncology

## 2023-03-21 DIAGNOSIS — E041 Nontoxic single thyroid nodule: Secondary | ICD-10-CM | POA: Diagnosis not present

## 2023-03-25 ENCOUNTER — Telehealth: Payer: Self-pay

## 2023-03-25 LAB — CYTOLOGY - NON PAP

## 2023-03-25 NOTE — Telephone Encounter (Signed)
-----   Message from Doylene Bode sent at 03/25/2023  9:05 AM EST ----- Could you please let the daughter and patient know her thyroid biopsy showed NO precancer or cancer? Thank you ----- Message ----- From: Interface, Lab In Three Zero One Sent: 03/25/2023   7:48 AM EST To: Doylene Bode, NP

## 2023-03-25 NOTE — Telephone Encounter (Signed)
I spoke to Seth Ward, Ms.Bonham' daughter, she is aware of Thyroid biopsy results. She will pass the message to her mom and will call with questions or concerns.

## 2023-04-09 ENCOUNTER — Ambulatory Visit
Admission: RE | Admit: 2023-04-09 | Discharge: 2023-04-09 | Disposition: A | Discharge status: Home / Self Care | Payer: HMO | Source: Ambulatory Visit | Attending: Family Medicine | Admitting: Family Medicine

## 2023-04-09 DIAGNOSIS — Z1231 Encounter for screening mammogram for malignant neoplasm of breast: Secondary | ICD-10-CM | POA: Diagnosis not present

## 2023-04-15 ENCOUNTER — Other Ambulatory Visit: Payer: HMO

## 2023-04-26 ENCOUNTER — Ambulatory Visit
Admission: RE | Admit: 2023-04-26 | Discharge: 2023-04-26 | Disposition: A | Discharge status: Home / Self Care | Payer: HMO | Source: Ambulatory Visit | Attending: Family Medicine | Admitting: Family Medicine

## 2023-04-26 DIAGNOSIS — Z90722 Acquired absence of ovaries, bilateral: Secondary | ICD-10-CM | POA: Diagnosis not present

## 2023-04-26 DIAGNOSIS — E2839 Other primary ovarian failure: Secondary | ICD-10-CM | POA: Diagnosis not present

## 2023-04-26 DIAGNOSIS — M8588 Other specified disorders of bone density and structure, other site: Secondary | ICD-10-CM | POA: Diagnosis not present

## 2023-04-26 DIAGNOSIS — N958 Other specified menopausal and perimenopausal disorders: Secondary | ICD-10-CM | POA: Diagnosis not present

## 2023-04-26 DIAGNOSIS — Z13820 Encounter for screening for osteoporosis: Secondary | ICD-10-CM

## 2023-04-29 ENCOUNTER — Telehealth: Payer: Self-pay | Admitting: *Deleted

## 2023-04-29 NOTE — Telephone Encounter (Signed)
Spoke with patient's daughter Byrd Hesselbach and relayed message from Warner Mccreedy, NP that they advise to reach out to Aarini's PCP. Byrd Hesselbach verbalized understanding and thanked the office for calling back.

## 2023-04-29 NOTE — Telephone Encounter (Signed)
Spoke with Patient's daughter Carrie Newman who called the office on behalf of her mother patient Carrie Newman. Carrie Newman states her mother has been having insomnia for the past three weeks. Carrie Newman states her mother denies any other symptoms. Patient has a follow up with Dr. Alvester Newman on 06/10/23.   When asked if Carrie Newman has any history of insomnia, Carrie Newman stated her mother had insomnia about 8 years ago related to anxiety from financial circumstances. Patient's daughter wasn't sure if insomnia was related to her mother's surgery on 9/3. Also wasn't sure if she needed to call PCP. Abbott Pao her message would be relayed to provider.

## 2023-05-03 DIAGNOSIS — Z90710 Acquired absence of both cervix and uterus: Secondary | ICD-10-CM | POA: Diagnosis not present

## 2023-05-03 DIAGNOSIS — M810 Age-related osteoporosis without current pathological fracture: Secondary | ICD-10-CM | POA: Diagnosis not present

## 2023-05-03 DIAGNOSIS — E041 Nontoxic single thyroid nodule: Secondary | ICD-10-CM | POA: Diagnosis not present

## 2023-05-03 DIAGNOSIS — G4700 Insomnia, unspecified: Secondary | ICD-10-CM | POA: Diagnosis not present

## 2023-05-03 DIAGNOSIS — Z90722 Acquired absence of ovaries, bilateral: Secondary | ICD-10-CM | POA: Diagnosis not present

## 2023-05-03 DIAGNOSIS — Z8542 Personal history of malignant neoplasm of other parts of uterus: Secondary | ICD-10-CM | POA: Diagnosis not present

## 2023-05-03 DIAGNOSIS — Z9079 Acquired absence of other genital organ(s): Secondary | ICD-10-CM | POA: Diagnosis not present

## 2023-05-08 DIAGNOSIS — N6452 Nipple discharge: Secondary | ICD-10-CM | POA: Diagnosis not present

## 2023-05-08 DIAGNOSIS — Z1211 Encounter for screening for malignant neoplasm of colon: Secondary | ICD-10-CM | POA: Diagnosis not present

## 2023-05-08 DIAGNOSIS — D241 Benign neoplasm of right breast: Secondary | ICD-10-CM | POA: Diagnosis not present

## 2023-05-08 DIAGNOSIS — R1033 Periumbilical pain: Secondary | ICD-10-CM | POA: Diagnosis not present

## 2023-05-31 ENCOUNTER — Other Ambulatory Visit: Payer: Self-pay | Admitting: Student

## 2023-05-31 ENCOUNTER — Ambulatory Visit
Admission: RE | Admit: 2023-05-31 | Discharge: 2023-05-31 | Disposition: A | Discharge status: Home / Self Care | Payer: HMO | Source: Ambulatory Visit | Attending: Student

## 2023-05-31 DIAGNOSIS — K5900 Constipation, unspecified: Secondary | ICD-10-CM

## 2023-06-05 ENCOUNTER — Encounter: Payer: Self-pay | Admitting: Psychiatry

## 2023-06-10 ENCOUNTER — Encounter: Payer: Self-pay | Admitting: Psychiatry

## 2023-06-10 ENCOUNTER — Inpatient Hospital Stay: Payer: HMO | Attending: Psychiatry | Admitting: Psychiatry

## 2023-06-10 VITALS — BP 140/82 | HR 65 | Temp 98.4°F | Resp 16 | Ht 62.0 in | Wt 136.2 lb

## 2023-06-10 DIAGNOSIS — C541 Malignant neoplasm of endometrium: Secondary | ICD-10-CM

## 2023-06-10 DIAGNOSIS — Z90722 Acquired absence of ovaries, bilateral: Secondary | ICD-10-CM | POA: Insufficient documentation

## 2023-06-10 DIAGNOSIS — Z9221 Personal history of antineoplastic chemotherapy: Secondary | ICD-10-CM | POA: Diagnosis not present

## 2023-06-10 DIAGNOSIS — R1084 Generalized abdominal pain: Secondary | ICD-10-CM

## 2023-06-10 DIAGNOSIS — R109 Unspecified abdominal pain: Secondary | ICD-10-CM

## 2023-06-10 DIAGNOSIS — Z9079 Acquired absence of other genital organ(s): Secondary | ICD-10-CM | POA: Diagnosis not present

## 2023-06-10 DIAGNOSIS — Z90710 Acquired absence of both cervix and uterus: Secondary | ICD-10-CM | POA: Diagnosis not present

## 2023-06-10 DIAGNOSIS — Z8542 Personal history of malignant neoplasm of other parts of uterus: Secondary | ICD-10-CM | POA: Diagnosis not present

## 2023-06-10 DIAGNOSIS — Z08 Encounter for follow-up examination after completed treatment for malignant neoplasm: Secondary | ICD-10-CM

## 2023-06-10 DIAGNOSIS — Z923 Personal history of irradiation: Secondary | ICD-10-CM | POA: Insufficient documentation

## 2023-06-10 NOTE — Progress Notes (Signed)
Gynecologic Oncology Return Clinic Visit  Date of Service: 06/10/2023 Referring Provider: Lorriane Shire, MD   Assessment & Plan: Carrie Newman is a 73 y.o. 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 Alternative, adjuvant treatment with EBRT. - Pt declines adjuvant treatment.  - NED on exam today  - Signs/symptoms of recurrence reviewed. - Recommend follow-up q68mo initially - Previously discussed surveillance with CT given high risk histology and declined adjuvant treatment. Pt amenable to this. Will get now given worsening abdominal pain although this pain appears to precede her endometrial cancer diagnosis. - If CT a/p negative, repeat in 8mo.     Thyroid nodule: - Biopsy 03/21/23 benign follicular nodule   RTC 84mo unless indicated sooner by CT scan  Clide Cliff, MD Gynecologic Oncology  Medical Decision Making I personally spent  TOTAL 20 minutes face-to-face and non-face-to-face in the care of this patient, which includes all pre, intra, and post visit time on the date of service.  ----------------------- Reason for Visit: Surveillance  Treatment History: Oncology History  Endometrial cancer (HCC)  12/01/2022 Imaging   CT abdomen/pelvis: IMPRESSION: No CT findings to account for the patient's gross hematuria.   3.5 cm fundal lesion, possibly reflecting a noncalcified fibroid versus endometrial thickening. Pelvic ultrasound is suggested for further evaluation.   Cholelithiasis, without associated inflammatory changes.   12/14/2022 Imaging   Pelvic ultrasound: IMPRESSION: Marked abnormal thickening of the endometrium measuring up to 32 mm highly suspicious for malignancy. Endometrial  thickness is considered abnormal for an asymptomatic post-menopausal female. Endometrial sampling should be considered to exclude carcinoma.   12/31/2022 Initial Biopsy   Endometrial biopsy: Clear-cell carcinoma COMMENT:  The carcinoma shows patchy positivity with Napsin A and is negative with  P504S, estrogen receptor and progesterone receptor.  Immunohistochemistry for p53 shows wild-type (non mutated) pattern.  The  findings are consistent with endometrial clear cell carcinoma.    12/31/2022 Initial Diagnosis   Endometrial cancer (HCC)   01/08/2023 Imaging   CT Chest:  IMPRESSION: 1. No evidence for metastatic disease in the chest. 2. 2.1 cm left thyroid nodule. Recommend thyroid US (ref: J Am Coll Radiol. 2015 Feb;12(2): 143-50). 3. 1.8 cm benign right adrenal adenoma. No follow-up imaging recommended. 4. Cholelithiasis. 5.  Aortic Atherosclerosis (ICD10-I70.0).   01/22/2023 Cancer Staging   Staging form: Corpus Uteri - Carcinoma and Carcinosarcoma, AJCC 8th Edition - Clinical stage from 01/22/2023: FIGO Stage IA (cT1a, cN0, cM0) - Signed by Clide Cliff, MD on 02/07/2023 Histopathologic type: Clear cell adenocarcinoma, NOS Stage prefix: Initial diagnosis Histologic grade (G): G3 Histologic grading system: 3 grade system Lymph-vascular invasion (LVI): LVI not present (absent)/not identified   01/22/2023 Surgery   Robotic-assisted total laparoscopic hysterectomy, bilateral salpingo-oophorectomy, bilateral sentinel lymph node evaluation, left sentinel lymph node biopsy, right pelvic and common iliac lymphadenectomy, omental biopsy    01/22/2023 Pathology Results   FINAL MICROSCOPIC DIAGNOSIS:  A. SENTINEL LYMPH NODE, LEFT EXTERNAL ILIAC, EXCISION:      One lymph node, negative for metastatic carcinoma (0/1).  B. UTERUS, CERVIX, BILATERAL FALLOPIAN TUBES AND OVARIES:       Endometrium:       Clear cell adenocarcinoma, NOS.      Carcinoma invades 25% of the full thickness of  myometrium (2 mm / 8 mm).  Lymphovascular invasion is not identified.      Pathologic Stage Classification (pTNM, AJCC 8th Edition): pT1a, pN0 FIGO Stage (2023 staging for cancer of the endometrium): IIC      See oncology table.       Other benign findings:  Cervix:           No ectocervical squamous epithelium identified.           Negative for malignancy. Endocervix:           Nabothian cyst.           Negative for hyperplasia, atypia or malignancy.      Endometrium:           Inactive endometrium.      Myometrium:           No other findings.      Serosa:           Unremarkable.           Negative for malignancy.      Bilateral fallopian tubes:           Benign fimbriated fallopian tubes with paratubal cysts.           Negative for malignancy.      Bilateral ovaries:           Benign ovarian parenchyma with inclusion cysts.           Negative for malignancy.  C. LYMPH NODE, RIGHT PELVIC, EXCISION:      Eight lymph nodes, negative for metastatic carcinoma (0/8).  D. LYMPH NODE, RIGHT COMMON ILIAC, EXCISION:      One lymph node, negative for metastatic carcinoma (0/1).  E. OMENTAL BIOPSY:      Benign mature adipose tissue.      Negative for malignancy.   ONCOLOGY TABLE:  UTERUS, CARCINOMA OR CARCINOSARCOMA: Resection  Procedure: Total hysterectomy and bilateral salpingo-oophorectomy Histologic Type: Clear cell adenocarcinoma, NOS Histologic Grade:  High-grade Myometrial Invasion:      Depth of Myometrial Invasion (mm): 2      Myometrial Thickness (mm): 8      Percentage of Myometrial Invasion: 25% Uterine Serosa Involvement: Not identified Cervical stromal Involvement: Not identified Extent of involvement of other tissue/organs: Not identified Peritoneal/Ascitic Fluid: Not involved Lymphovascular Invasion: Not identified Regional Lymph Nodes:      Pelvic Lymph Nodes Examined: 10          [1] Sentinel          [9] Non-sentinel          [10] Total       Pelvic Lymph Nodes with Metastasis: 0          Macrometastasis: (>2.0 mm): 0          Micrometastasis: (>0.2 mm and < 2.0 mm): 0          Isolated Tumor Cells (<0.2 mm): 0          Laterality of Lymph Node with Tumor: NA          Extracapsular Extension: NA      Para-aortic Lymph Nodes Examined: 0          [0] Sentinel          [0] Non-sentinel          [0] Total      Para-aortic Lymph Nodes with Metastasis: NA          Macrometastasis: (>2.0 mm): NA  Micrometastasis:  (>0.2 mm and < 2.0 mm): NA          Isolated Tumor Cells (<0.2 mm): NA          Laterality of Lymph Node with Tumor: NA          Extracapsular Extension: NA Distant Metastasis:      Distant Site(s) Involved: Not applicable Pathologic Stage Classification (pTNM, AJCC 8th Edition): pT1a, pN0 FIGO Stage (2023 staging for cancer of the endometrium): IIC Ancillary Studies: MMR / MSI testing will be ordered Representative Tumor Block: B4-B10 Comment(s): None   IHC EXPRESSION RESULTS   TEST           RESULT  MLH1:          Preserved nuclear expression  MSH2:          Preserved nuclear expression  MSH6:          Preserved nuclear expression  PMS2:          Preserved nuclear expression      Interval History: Presents with daughter today. They report that she was diagnosed with osteoporosis by her PCP. She has been having abdominal pain and is scheduled for colonoscopy on 07/03/23. And she reports insomnia.   She describes abdominal pain as constant.  Occurs in the mid upper abdomen and radiates to the bilateral lower quadrants, left more often than right.  She has had the pain for about 5 years but it is becoming a little worse more recently with having more prolonged episodes.  The pain otherwise comes and goes but may last for a few weeks when it comes.  Sometimes the pain is improved with medication, dicyclomine.  Other times it is made worse by eating but this is unpredictable.  Also has occasional  heartburn.  She otherwise denies new vaginal bleeding, unintentional weight loss, change in bowel or bladder habits, early satiety, bloating, nausea/vomiting.     Past Medical/Surgical History: Past Medical History:  Diagnosis Date   Cancer (HCC)    GERD (gastroesophageal reflux disease)    History of hiatal hernia    Hyperlipidemia    IBS (irritable bowel syndrome)     Past Surgical History:  Procedure Laterality Date   BREAST BIOPSY Right    x2   CESAREAN SECTION     1978   CESAREAN SECTION     1980   CESAREAN SECTION     1984   COLONOSCOPY     2014   LYMPH NODE DISSECTION N/A 01/22/2023   Procedure: RIGHT PELVIC LYMPHADENECTOMY;  Surgeon: Clide Cliff, MD;  Location: [facility];  Service: Gynecology;  Laterality: N/A;   SENTINEL NODE BIOPSY N/A 01/22/2023   Procedure: LEFT SENTINEL NODE BIOPSY;  Surgeon: Clide Cliff, MD;  Location: [facility];  Service: Gynecology;  Laterality: N/A;    Family History  Problem Relation Age of Onset   Leukemia Brother    Endometrial cancer Niece    Breast cancer Neg Hx    Colon cancer Neg Hx    Ovarian cancer Neg Hx    Pancreatic cancer Neg Hx    Prostate cancer Neg Hx     Social History   Socioeconomic History   Marital status: Married    Spouse name: Not on file   Number of children: Not on file   Years of education: Not on file   Highest education level: Not on file  Occupational History   Not on file  Tobacco Use   Smoking status:  Never    Passive exposure: Never   Smokeless tobacco: Never  Vaping Use   Vaping status: Never Used  Substance and Sexual Activity   Alcohol use: Not Currently   Drug use: Never   Sexual activity: Not Currently  Other Topics Concern   Not on file  Social History Narrative   Not on file   Social Drivers of Health   Financial Resource Strain: Not on file  Food Insecurity: Not on file  Transportation Needs: Not on file  Physical Activity: Not on file  Stress: Not on file  Social  Connections: Not on file    Current Medications:  Current Outpatient Medications:    alendronate (FOSAMAX) 70 MG tablet, 1 tablet 30 minutes before the first food, beverage or medicine of the day with plain water Orally once per week for 90 days, Disp: , Rfl:    Ascorbic Acid (VITAMIN C PO), Take 1 tablet by mouth daily., Disp: , Rfl:    Cholecalciferol 75 MCG (3000 UT) TABS, Take 3,000 Units by mouth daily., Disp: , Rfl:    cyanocobalamin (VITAMIN B12) 1000 MCG tablet, Take 1,000 mcg by mouth daily., Disp: , Rfl:    dicyclomine (BENTYL) 20 MG tablet, Take 20 mg by mouth every 6 (six) hours. Per patient take as needed for pain, Disp: , Rfl:    Melatonin 5 MG CHEW, as directed Orally, Disp: , Rfl:   Review of Symptoms: Complete 10-system review is positive for: Difficulty concentrating, abdominal pain no  Physical Exam: BP (!) 144/73 (BP Location: Left Arm, Patient Position: Sitting) Comment: Nurse notified  Pulse 65   Temp 98.4 F (36.9 C) (Oral)   Resp 16   Ht 5\' 2"  (1.575 m)   Wt 136 lb 3.2 oz (61.8 kg)   SpO2 98%   BMI 24.91 kg/m  General: Alert, oriented, no acute distress. HEENT: Normocephalic, atraumatic. Neck symmetric without masses. Sclera anicteric.  Chest: Normal work of breathing. Clear to auscultation bilaterally.   Cardiovascular: Regular rate and rhythm, no murmurs. Abdomen: Soft, nontender.  Normoactive bowel sounds.  No masses appreciated.  Well-healed incisions. Extremities: Grossly normal range of motion.  Warm, well perfused.  No edema bilaterally. Skin: No rashes or lesions noted. Lymphatics: No cervical, supraclavicular, or inguinal adenopathy. GU: Normal appearing external genitalia without erythema, excoriation, or lesions.  Speculum exam reveals normal vaginal cuff without lesion.  Bimanual exam reveals smooth cuff, no nodularity or pelvic mass.  Exam chaperoned by Kimberly [place], CMA     Laboratory & Radiologic Studies: Surgical pathology  (03/21/23): FINAL MICROSCOPIC DIAGNOSIS:  - Benign follicular nodule (Bethesda category II)

## 2023-06-12 ENCOUNTER — Telehealth: Payer: Self-pay

## 2023-06-12 NOTE — Telephone Encounter (Signed)
I spoke to Carrie Newman, pt's daughter,who is interpreter for her mom,  she states Dr. Alvester Morin mentioned getting a CT due to the stomach pain her mom was having. Pt states when she was seen by her GI doctor last week he recommended a medication to break up a blockage seen on the x-ray. Pt wants to try med to see if pain will go away.  Pt reports she wants to wait on getting the CT for April when she comes to follow up with Dr.Newton. If pain does not subside she will call to get CT moved up to sooner date.   Pt aware I will pass message on to Dr.Newton.  CT scheduled for 4/23 and follow up appointment on 4/28 until otherwise noted by provider.

## 2023-06-24 LAB — MOLECULAR PATHOLOGY

## 2023-07-03 DIAGNOSIS — D124 Benign neoplasm of descending colon: Secondary | ICD-10-CM | POA: Diagnosis not present

## 2023-09-11 ENCOUNTER — Encounter: Payer: Self-pay | Admitting: Radiology

## 2023-09-11 ENCOUNTER — Ambulatory Visit
Admission: RE | Admit: 2023-09-11 | Discharge: 2023-09-11 | Disposition: A | Discharge status: Home / Self Care | Payer: HMO | Source: Ambulatory Visit | Attending: Psychiatry | Admitting: Psychiatry

## 2023-09-11 DIAGNOSIS — K5732 Diverticulitis of large intestine without perforation or abscess without bleeding: Secondary | ICD-10-CM | POA: Diagnosis not present

## 2023-09-11 DIAGNOSIS — Z90710 Acquired absence of both cervix and uterus: Secondary | ICD-10-CM | POA: Diagnosis not present

## 2023-09-11 DIAGNOSIS — R109 Unspecified abdominal pain: Secondary | ICD-10-CM | POA: Diagnosis not present

## 2023-09-11 DIAGNOSIS — C541 Malignant neoplasm of endometrium: Secondary | ICD-10-CM

## 2023-09-11 DIAGNOSIS — R1084 Generalized abdominal pain: Secondary | ICD-10-CM

## 2023-09-11 MED ORDER — IOPAMIDOL (ISOVUE-300) INJECTION 61%
100.0000 mL | Freq: Once | INTRAVENOUS | Status: AC | PRN
  Administered 2023-09-11: 100 mL via INTRAVENOUS

## 2023-09-13 ENCOUNTER — Encounter: Payer: Self-pay | Admitting: Psychiatry

## 2023-09-13 ENCOUNTER — Telehealth: Payer: Self-pay

## 2023-09-13 NOTE — Telephone Encounter (Signed)
 Per Provider, pt was rescheduled from 4/28 to 6/2 for follow up appointment, Pt's daughter states her mom is doing fine with no concerns. She declined an earlier appointment 5/12 or 5/19 stating they will be out of town.

## 2023-09-16 ENCOUNTER — Inpatient Hospital Stay: Payer: HMO | Admitting: Psychiatry

## 2023-09-16 DIAGNOSIS — C541 Malignant neoplasm of endometrium: Secondary | ICD-10-CM

## 2023-09-17 ENCOUNTER — Telehealth: Payer: Self-pay | Admitting: *Deleted

## 2023-09-17 NOTE — Telephone Encounter (Signed)
 Spoke with patient's daughter Carrie Newman, with the patient Carrie Newman present as well. Relayed message from Dr. Daisey Dryer that pt's CT scan does not show any concerns for cancer recurrence or spread. There is some evidence of mild diverticulitis. If pt is still having abdominal pain or diarrhea, she should follow-up with her PCP for management of diverticulitis. Carrie Newman and the patient received email of results and verbalized understanding and thanked the office for calling.

## 2023-09-17 NOTE — Progress Notes (Signed)
 This encounter was created in error - please disregard.

## 2023-09-17 NOTE — Telephone Encounter (Signed)
-----   Message from [place], [place] sent at 09/17/2023  2:30 PM EDT ----- Please call pt and let her know that her CT scan does not show any concerns for cancer recurrence or spread. There is some evidence of mild diverticulitis. If she is still having abdominal pain or diarrhea, she should follow-up with her PCP for management of diverticulitis.

## 2023-10-15 DIAGNOSIS — Z131 Encounter for screening for diabetes mellitus: Secondary | ICD-10-CM | POA: Diagnosis not present

## 2023-10-15 DIAGNOSIS — Z124 Encounter for screening for malignant neoplasm of cervix: Secondary | ICD-10-CM | POA: Diagnosis not present

## 2023-10-15 DIAGNOSIS — Z1231 Encounter for screening mammogram for malignant neoplasm of breast: Secondary | ICD-10-CM | POA: Diagnosis not present

## 2023-10-15 DIAGNOSIS — E785 Hyperlipidemia, unspecified: Secondary | ICD-10-CM | POA: Diagnosis not present

## 2023-10-15 DIAGNOSIS — C541 Malignant neoplasm of endometrium: Secondary | ICD-10-CM | POA: Diagnosis not present

## 2023-10-15 DIAGNOSIS — Z1331 Encounter for screening for depression: Secondary | ICD-10-CM | POA: Diagnosis not present

## 2023-10-15 DIAGNOSIS — Z6827 Body mass index (BMI) 27.0-27.9, adult: Secondary | ICD-10-CM | POA: Diagnosis not present

## 2023-10-15 DIAGNOSIS — Z13220 Encounter for screening for lipoid disorders: Secondary | ICD-10-CM | POA: Diagnosis not present

## 2023-10-15 DIAGNOSIS — Z1211 Encounter for screening for malignant neoplasm of colon: Secondary | ICD-10-CM | POA: Diagnosis not present

## 2023-10-15 DIAGNOSIS — K219 Gastro-esophageal reflux disease without esophagitis: Secondary | ICD-10-CM | POA: Diagnosis not present

## 2023-10-15 DIAGNOSIS — Z0000 Encounter for general adult medical examination without abnormal findings: Secondary | ICD-10-CM | POA: Diagnosis not present

## 2023-10-21 ENCOUNTER — Encounter: Payer: Self-pay | Admitting: Psychiatry

## 2023-10-21 ENCOUNTER — Inpatient Hospital Stay: Attending: Psychiatry | Admitting: Psychiatry

## 2023-10-21 VITALS — BP 123/70 | HR 68 | Temp 98.2°F | Resp 16 | Ht 62.0 in | Wt 135.2 lb

## 2023-10-21 DIAGNOSIS — Z8542 Personal history of malignant neoplasm of other parts of uterus: Secondary | ICD-10-CM | POA: Diagnosis not present

## 2023-10-21 DIAGNOSIS — C541 Malignant neoplasm of endometrium: Secondary | ICD-10-CM

## 2023-10-21 DIAGNOSIS — Z90710 Acquired absence of both cervix and uterus: Secondary | ICD-10-CM | POA: Diagnosis not present

## 2023-10-21 DIAGNOSIS — K5732 Diverticulitis of large intestine without perforation or abscess without bleeding: Secondary | ICD-10-CM | POA: Insufficient documentation

## 2023-10-21 DIAGNOSIS — Z90722 Acquired absence of ovaries, bilateral: Secondary | ICD-10-CM | POA: Diagnosis not present

## 2023-10-21 DIAGNOSIS — E041 Nontoxic single thyroid nodule: Secondary | ICD-10-CM | POA: Diagnosis not present

## 2023-10-21 DIAGNOSIS — Z9079 Acquired absence of other genital organ(s): Secondary | ICD-10-CM | POA: Insufficient documentation

## 2023-10-21 NOTE — Patient Instructions (Signed)
 It was a pleasure to see you in clinic today. - Normal exam today. CT from April with no concerns for cancer. - Return visit planned for 3 months  Thank you very much for allowing me to provide care for you today.  I appreciate your confidence in choosing our Gynecologic Oncology team at [facility].  If you have any questions about your visit today please call our office or send us  a MyChart message and we will get back to you as soon as possible.

## 2023-10-21 NOTE — Progress Notes (Signed)
 Gynecologic Oncology Return Clinic Visit  Date of Service: 10/21/2023 Referring Provider: Kiki Pelton, MD   Assessment & Plan: Carrie Newman is a 73 y.o. 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 Alternative, adjuvant treatment with EBRT. - Pt declines adjuvant treatment.  - NED on exam today  - Signs/symptoms of recurrence reviewed. - Recommend follow-up q34mo initially - Previously discussed surveillance with CT given high risk histology and declined adjuvant treatment. CT 09/11/23 NED apart from mild diverticulitis. Repeat in 52mo  Diverticulitis: - Noted on imaging - Pt has reviewed with PCP and recommended to reach out of recurrent symptoms.   Thyroid  nodule: - Biopsy 03/21/23 benign follicular nodule   RTC 53mo  Derrel Flies, MD Gynecologic Oncology  Medical Decision Making I personally spent  TOTAL 20 minutes face-to-face and non-face-to-face in the care of this patient, which includes all pre, intra, and post visit time on the date of service.  ----------------------- Reason for Visit: Surveillance  Treatment History: Oncology History  Endometrial cancer (HCC)  12/01/2022 Imaging   CT abdomen/pelvis: IMPRESSION: No CT findings to account for the patient's gross hematuria.   3.5 cm fundal lesion, possibly reflecting a noncalcified fibroid versus endometrial thickening. Pelvic ultrasound is suggested for further evaluation.   Cholelithiasis, without associated inflammatory changes.   12/14/2022 Imaging   Pelvic ultrasound: IMPRESSION: Marked abnormal thickening of the endometrium measuring up to 32 mm highly suspicious for malignancy. Endometrial thickness is considered abnormal for  an asymptomatic post-menopausal female. Endometrial sampling should be considered to exclude carcinoma.   12/31/2022 Initial Biopsy   Endometrial biopsy: Clear-cell carcinoma COMMENT:  The carcinoma shows patchy positivity with Napsin A and is negative with  P504S, estrogen receptor and progesterone receptor.  Immunohistochemistry for p53 shows wild-type (non mutated) pattern.  The  findings are consistent with endometrial clear cell carcinoma.    12/31/2022 Initial Diagnosis   Endometrial cancer (HCC)   01/08/2023 Imaging   CT Chest:  IMPRESSION: 1. No evidence for metastatic disease in the chest. 2. 2.1 cm left thyroid  nodule. Recommend thyroid  US  (ref: J Am Coll Radiol. 2015 Feb;12(2): 143-50). 3. 1.8 cm benign right adrenal adenoma. No follow-up imaging recommended. 4. Cholelithiasis. 5.  Aortic Atherosclerosis (ICD10-I70.0).   01/22/2023 Cancer Staging   Staging form: Corpus Uteri - Carcinoma and Carcinosarcoma, AJCC 8th Edition - Clinical stage from 01/22/2023: FIGO Stage IA (cT1a, cN0, cM0) - Signed by Derrel Flies, MD on 02/07/2023 Histopathologic type: Clear cell adenocarcinoma, NOS Stage prefix: Initial diagnosis Histologic grade (G): G3 Histologic grading system: 3 grade system Lymph-vascular invasion (LVI): LVI not present (absent)/not identified   01/22/2023 Surgery   Robotic-assisted total laparoscopic hysterectomy, bilateral salpingo-oophorectomy, bilateral sentinel lymph node evaluation, left sentinel lymph node biopsy, right pelvic and common iliac lymphadenectomy, omental biopsy    01/22/2023 Pathology Results   FINAL MICROSCOPIC DIAGNOSIS:  A. SENTINEL LYMPH NODE, LEFT EXTERNAL ILIAC, EXCISION:      One lymph node, negative for metastatic carcinoma (0/1).  B. UTERUS, CERVIX, BILATERAL FALLOPIAN TUBES AND OVARIES:       Endometrium:       Clear cell adenocarcinoma, NOS.      Carcinoma invades 25% of the full thickness of myometrium (2 mm / 8 mm).       Lymphovascular invasion  is not identified.      Pathologic Stage Classification (pTNM, AJCC 8th Edition): pT1a, pN0 FIGO Stage (2023 staging for cancer of the endometrium): IIC      See oncology table.       Other benign findings:  Cervix:           No ectocervical squamous epithelium identified.           Negative for malignancy. Endocervix:           Nabothian cyst.           Negative for hyperplasia, atypia or malignancy.      Endometrium:           Inactive endometrium.      Myometrium:           No other findings.      Serosa:           Unremarkable.           Negative for malignancy.      Bilateral fallopian tubes:           Benign fimbriated fallopian tubes with paratubal cysts.           Negative for malignancy.      Bilateral ovaries:           Benign ovarian parenchyma with inclusion cysts.           Negative for malignancy.  C. LYMPH NODE, RIGHT PELVIC, EXCISION:      Eight lymph nodes, negative for metastatic carcinoma (0/8).  D. LYMPH NODE, RIGHT COMMON ILIAC, EXCISION:      One lymph node, negative for metastatic carcinoma (0/1).  E. OMENTAL BIOPSY:      Benign mature adipose tissue.      Negative for malignancy.   ONCOLOGY TABLE:  UTERUS, CARCINOMA OR CARCINOSARCOMA: Resection  Procedure: Total hysterectomy and bilateral salpingo-oophorectomy Histologic Type: Clear cell adenocarcinoma, NOS Histologic Grade:  High-grade Myometrial Invasion:      Depth of Myometrial Invasion (mm): 2      Myometrial Thickness (mm): 8      Percentage of Myometrial Invasion: 25% Uterine Serosa Involvement: Not identified Cervical stromal Involvement: Not identified Extent of involvement of other tissue/organs: Not identified Peritoneal/Ascitic Fluid: Not involved Lymphovascular Invasion: Not identified Regional Lymph Nodes:      Pelvic Lymph Nodes Examined: 10          [1] Sentinel          [9] Non-sentinel          [10] Total      Pelvic Lymph Nodes with  Metastasis: 0          Macrometastasis: (>2.0 mm): 0          Micrometastasis: (>0.2 mm and < 2.0 mm): 0          Isolated Tumor Cells (<0.2 mm): 0          Laterality of Lymph Node with Tumor: NA          Extracapsular Extension: NA      Para-aortic Lymph Nodes Examined: 0          [0] Sentinel          [0] Non-sentinel          [0] Total      Para-aortic Lymph Nodes with Metastasis: NA          Macrometastasis: (>2.0 mm): NA          Micrometastasis:  (>  0.2 mm and < 2.0 mm): NA          Isolated Tumor Cells (<0.2 mm): NA          Laterality of Lymph Node with Tumor: NA          Extracapsular Extension: NA Distant Metastasis:      Distant Site(s) Involved: Not applicable Pathologic Stage Classification (pTNM, AJCC 8th Edition): pT1a, pN0 FIGO Stage (2023 staging for cancer of the endometrium): IIC Ancillary Studies: MMR / MSI testing will be ordered Representative Tumor Block: B4-B10 Comment(s): None   IHC EXPRESSION RESULTS   TEST           RESULT  MLH1:          Preserved nuclear expression  MSH2:          Preserved nuclear expression  MSH6:          Preserved nuclear expression  PMS2:          Preserved nuclear expression      Interval History: Presents with daughter today. Overall doing well. Abdominal pain has improved. Sleeping better. She did let her PCP know about diverticulitis finding on CT and recommended to let them know if it flairs again.  No new vaginal bleeding, abdominal/pelvic pain, unintentional weight loss, change in bowel or bladder habits, early satiety, bloating, nausea/vomiting.    Past Medical/Surgical History: Past Medical History:  Diagnosis Date   Cancer (HCC)    GERD (gastroesophageal reflux disease)    History of hiatal hernia    Hyperlipidemia    IBS (irritable bowel syndrome)     Past Surgical History:  Procedure Laterality Date   BREAST BIOPSY Right    x2   CESAREAN SECTION     1978   CESAREAN SECTION     1980   CESAREAN  SECTION     1984   COLONOSCOPY     2014, 06/2022   LYMPH NODE DISSECTION N/A 01/22/2023   Procedure: RIGHT PELVIC LYMPHADENECTOMY;  Surgeon: Derrel Flies, MD;  Location: [facility];  Service: Gynecology;  Laterality: N/A;   SENTINEL NODE BIOPSY N/A 01/22/2023   Procedure: LEFT SENTINEL NODE BIOPSY;  Surgeon: Derrel Flies, MD;  Location: [facility];  Service: Gynecology;  Laterality: N/A;    Family History  Problem Relation Age of Onset   Leukemia Brother    Endometrial cancer Niece    Breast cancer Neg Hx    Colon cancer Neg Hx    Ovarian cancer Neg Hx    Pancreatic cancer Neg Hx    Prostate cancer Neg Hx     Social History   Socioeconomic History   Marital status: Married    Spouse name: Not on file   Number of children: Not on file   Years of education: Not on file   Highest education level: Not on file  Occupational History   Not on file  Tobacco Use   Smoking status: Never    Passive exposure: Never   Smokeless tobacco: Never  Vaping Use   Vaping status: Never Used  Substance and Sexual Activity   Alcohol use: Not Currently   Drug use: Never   Sexual activity: Not Currently  Other Topics Concern   Not on file  Social History Narrative   Not on file   Social Drivers of Health   Financial Resource Strain: Not on file  Food Insecurity: Not on file  Transportation Needs: Not on file  Physical Activity: Not on file  Stress:  Not on file  Social Connections: Not on file    Current Medications:  Current Outpatient Medications:    Ascorbic Acid (VITAMIN C PO), Take 1 tablet by mouth daily., Disp: , Rfl:    Cholecalciferol 75 MCG (3000 UT) TABS, Take 3,000 Units by mouth daily., Disp: , Rfl:    cyanocobalamin (VITAMIN B12) 1000 MCG tablet, Take 1,000 mcg by mouth daily., Disp: , Rfl:    dicyclomine (BENTYL) 20 MG tablet, Take 20 mg by mouth every 6 (six) hours. Per patient take as needed for pain, Disp: , Rfl:    Melatonin 5 MG CHEW, as directed Orally, Disp: ,  Rfl:   Review of Symptoms: Complete 10-system review is positive for: none  Physical Exam: BP 123/70 (BP Location: Left Arm, Patient Position: Sitting)   Pulse 68   Temp 98.2 F (36.8 C) (Oral)   Resp 16   Ht 5\' 2"  (1.575 m)   Wt 135 lb 3.2 oz (61.3 kg)   SpO2 98%   BMI 24.73 kg/m  General: Alert, oriented, no acute distress. HEENT: Normocephalic, atraumatic. Neck symmetric without masses. Sclera anicteric.  Chest: Normal work of breathing. Clear to auscultation bilaterally.   Cardiovascular: Regular rate and rhythm, no murmurs. Abdomen: Soft, nontender.  Normoactive bowel sounds.  No masses appreciated.  Well-healed incisions. Extremities: Grossly normal range of motion.  Warm, well perfused.  No edema bilaterally. Skin: No rashes or lesions noted. Lymphatics: No cervical, supraclavicular, or inguinal adenopathy. GU: Normal appearing external genitalia without erythema, excoriation, or lesions.  Speculum exam reveals normal vaginal cuff without lesion.  Bimanual exam reveals smooth cuff, no nodularity or pelvic mass.  Exam chaperoned by Kimberly Swazil[place], CMA   Laboratory & Radiologic Studies: CT ABDOMEN PELVIS W CONTRAST 09/11/2023  Narrative CLINICAL DATA:  Endometrial carcinoma. Worsening abdominal pain. Surveillance exam. * Tracking Code: BO *  EXAM: CT ABDOMEN AND PELVIS WITH CONTRAST  TECHNIQUE: Multidetector CT imaging of the abdomen and pelvis was performed using the standard protocol following bolus administration of intravenous contrast.  RADIATION DOSE REDUCTION: This exam was performed according to the departmental dose-optimization program which includes automated exposure control, adjustment of the mA and/or kV according to patient size and/or use of iterative reconstruction technique.  CONTRAST:  100mL ISOVUE -300 IOPAMIDOL  (ISOVUE -300) INJECTION 61%  COMPARISON:  None Available.  FINDINGS: Lower chest: Lung bases are clear.  Hepatobiliary: No focal  hepatic lesion. Normal gallbladder. No biliary duct dilatation. Common bile duct is normal.  Pancreas: Pancreas is normal. No ductal dilatation. No pancreatic inflammation.  Spleen: Normal spleen  Adrenals/urinary tract: Adrenal glands and kidneys are normal. The ureters and bladder normal.  Stomach/Bowel: Stomach, small bowel, appendix, and cecum are normal. Multiple diverticula of the descending colon and sigmoid colon. Mild stranding in the peritoneal fat adjacent to the proximal descending colon (image 33/2) no perforation or abscess. Rectum normal.  Vascular/Lymphatic: Abdominal aorta is normal caliber. No periportal or retroperitoneal adenopathy. No pelvic adenopathy.  Reproductive: Post hysterectomy. Adnexa unremarkable no pelvic sidewall abnormality. No pelvic lymphadenopathy.  Other: No free fluid.  Musculoskeletal: No aggressive osseous lesion.  IMPRESSION: 1. No evidence of endometrial carcinoma recurrence or metastasis in the abdomen pelvis. 2. Mild diverticulitis of the proximal descending colon. No perforation or abscess. 3. Post hysterectomy.  These results will be called to the ordering clinician or representative by the Radiologist Assistant, and communication documented in the PACS or [employer].   Electronically Signed By: Deboraha Fallow M.D. On: 09/13/2023 15:26

## 2023-11-19 DIAGNOSIS — Z6827 Body mass index (BMI) 27.0-27.9, adult: Secondary | ICD-10-CM | POA: Diagnosis not present

## 2023-12-19 DIAGNOSIS — M810 Age-related osteoporosis without current pathological fracture: Secondary | ICD-10-CM | POA: Diagnosis not present

## 2023-12-19 DIAGNOSIS — Z8542 Personal history of malignant neoplasm of other parts of uterus: Secondary | ICD-10-CM | POA: Diagnosis not present

## 2024-01-19 DIAGNOSIS — Z8542 Personal history of malignant neoplasm of other parts of uterus: Secondary | ICD-10-CM | POA: Diagnosis not present

## 2024-01-19 DIAGNOSIS — M810 Age-related osteoporosis without current pathological fracture: Secondary | ICD-10-CM | POA: Diagnosis not present

## 2024-01-22 ENCOUNTER — Encounter: Payer: Self-pay | Admitting: Psychiatry

## 2024-01-27 ENCOUNTER — Inpatient Hospital Stay: Attending: Psychiatry | Admitting: Psychiatry

## 2024-01-27 ENCOUNTER — Encounter: Payer: Self-pay | Admitting: Psychiatry

## 2024-01-27 VITALS — BP 126/66 | HR 61 | Temp 98.9°F | Resp 20 | Wt 137.0 lb

## 2024-01-27 DIAGNOSIS — Z8542 Personal history of malignant neoplasm of other parts of uterus: Secondary | ICD-10-CM | POA: Diagnosis not present

## 2024-01-27 DIAGNOSIS — Z9079 Acquired absence of other genital organ(s): Secondary | ICD-10-CM | POA: Insufficient documentation

## 2024-01-27 DIAGNOSIS — Z923 Personal history of irradiation: Secondary | ICD-10-CM | POA: Diagnosis not present

## 2024-01-27 DIAGNOSIS — Z90710 Acquired absence of both cervix and uterus: Secondary | ICD-10-CM | POA: Insufficient documentation

## 2024-01-27 DIAGNOSIS — Z90722 Acquired absence of ovaries, bilateral: Secondary | ICD-10-CM | POA: Insufficient documentation

## 2024-01-27 DIAGNOSIS — C541 Malignant neoplasm of endometrium: Secondary | ICD-10-CM

## 2024-01-27 NOTE — Patient Instructions (Signed)
 It was a pleasure to see you in clinic today. - Normal exam today - CT scan around end of October - Return visit planned for 4 months  Thank you very much for allowing me to provide care for you today.  I appreciate your confidence in choosing our Gynecologic Oncology team at [facility].  If you have any questions about your visit today please call our office or send us  a MyChart message and we will get back to you as soon as possible.

## 2024-01-27 NOTE — Progress Notes (Signed)
 Gynecologic Oncology Return Clinic Visit  Date of Service: 01/27/2024 Referring Provider: Carter Quarry, MD   Assessment & Plan: Carrie Newman is a 73 y.o. 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 Alternative, adjuvant treatment with EBRT. - Pt declines adjuvant treatment.  - NED on exam today  - Signs/symptoms of recurrence reviewed. - Recommend follow-up q81mo initially. Given 1 year NED, pt wishes to space out visits, so will space to q22mo.  - Previously discussed surveillance with CT given high risk histology and declined adjuvant treatment. CT 09/11/23 NED apart from mild diverticulitis. Repeat in 79mo. Ordered today.   Thyroid  nodule: - Biopsy 03/21/23 benign follicular nodule   RTC 57mo  Hoy Masters, MD Gynecologic Oncology  Medical Decision Making I personally spent  TOTAL 15 minutes face-to-face and non-face-to-face in the care of this patient, which includes all pre, intra, and post visit time on the date of service.  ----------------------- Reason for Visit: Surveillance  Treatment History: Oncology History  Endometrial cancer (HCC)  12/01/2022 Imaging   CT abdomen/pelvis: IMPRESSION: No CT findings to account for the patient's gross hematuria.   3.5 cm fundal lesion, possibly reflecting a noncalcified fibroid versus endometrial thickening. Pelvic ultrasound is suggested for further evaluation.   Cholelithiasis, without associated inflammatory changes.   12/14/2022 Imaging   Pelvic ultrasound: IMPRESSION: Marked abnormal thickening of the endometrium measuring up to 32 mm highly suspicious for malignancy. Endometrial thickness is considered abnormal for an asymptomatic  post-menopausal female. Endometrial sampling should be considered to exclude carcinoma.   12/31/2022 Initial Biopsy   Endometrial biopsy: Clear-cell carcinoma COMMENT:  The carcinoma shows patchy positivity with Napsin A and is negative with  P504S, estrogen receptor and progesterone receptor.  Immunohistochemistry for p53 shows wild-type (non mutated) pattern.  The  findings are consistent with endometrial clear cell carcinoma.    12/31/2022 Initial Diagnosis   Endometrial cancer (HCC)   01/08/2023 Imaging   CT Chest:  IMPRESSION: 1. No evidence for metastatic disease in the chest. 2. 2.1 cm left thyroid  nodule. Recommend thyroid  US  (ref: J Am Coll Radiol. 2015 Feb;12(2): 143-50). 3. 1.8 cm benign right adrenal adenoma. No follow-up imaging recommended. 4. Cholelithiasis. 5.  Aortic Atherosclerosis (ICD10-I70.0).   01/22/2023 Cancer Staging   Staging form: Corpus Uteri - Carcinoma and Carcinosarcoma, AJCC 8th Edition - Clinical stage from 01/22/2023: FIGO Stage IA (cT1a, cN0, cM0) - Signed by Masters Hoy, MD on 02/07/2023 Histopathologic type: Clear cell adenocarcinoma, NOS Stage prefix: Initial diagnosis Histologic grade (G): G3 Histologic grading system: 3 grade system Lymph-vascular invasion (LVI): LVI not present (absent)/not identified   01/22/2023 Surgery   Robotic-assisted total laparoscopic hysterectomy, bilateral salpingo-oophorectomy, bilateral sentinel lymph node evaluation, left sentinel lymph node biopsy, right pelvic and common iliac lymphadenectomy, omental biopsy    01/22/2023 Pathology Results   FINAL MICROSCOPIC DIAGNOSIS:  A. SENTINEL LYMPH NODE, LEFT EXTERNAL ILIAC, EXCISION:      One lymph node, negative for metastatic carcinoma (0/1).  B. UTERUS, CERVIX, BILATERAL FALLOPIAN TUBES AND OVARIES:       Endometrium:       Clear cell adenocarcinoma, NOS.      Carcinoma invades 25% of the full thickness of myometrium (2 mm / 8 mm).      Lymphovascular  invasion is not  identified.      Pathologic Stage Classification (pTNM, AJCC 8th Edition): pT1a, pN0 FIGO Stage (2023 staging for cancer of the endometrium): IIC      See oncology table.       Other benign findings:  Cervix:           No ectocervical squamous epithelium identified.           Negative for malignancy. Endocervix:           Nabothian cyst.           Negative for hyperplasia, atypia or malignancy.      Endometrium:           Inactive endometrium.      Myometrium:           No other findings.      Serosa:           Unremarkable.           Negative for malignancy.      Bilateral fallopian tubes:           Benign fimbriated fallopian tubes with paratubal cysts.           Negative for malignancy.      Bilateral ovaries:           Benign ovarian parenchyma with inclusion cysts.           Negative for malignancy.  C. LYMPH NODE, RIGHT PELVIC, EXCISION:      Eight lymph nodes, negative for metastatic carcinoma (0/8).  D. LYMPH NODE, RIGHT COMMON ILIAC, EXCISION:      One lymph node, negative for metastatic carcinoma (0/1).  E. OMENTAL BIOPSY:      Benign mature adipose tissue.      Negative for malignancy.   ONCOLOGY TABLE:  UTERUS, CARCINOMA OR CARCINOSARCOMA: Resection  Procedure: Total hysterectomy and bilateral salpingo-oophorectomy Histologic Type: Clear cell adenocarcinoma, NOS Histologic Grade:  High-grade Myometrial Invasion:      Depth of Myometrial Invasion (mm): 2      Myometrial Thickness (mm): 8      Percentage of Myometrial Invasion: 25% Uterine Serosa Involvement: Not identified Cervical stromal Involvement: Not identified Extent of involvement of other tissue/organs: Not identified Peritoneal/Ascitic Fluid: Not involved Lymphovascular Invasion: Not identified Regional Lymph Nodes:      Pelvic Lymph Nodes Examined: 10          [1] Sentinel          [9] Non-sentinel          [10] Total      Pelvic Lymph Nodes with Metastasis: 0           Macrometastasis: (>2.0 mm): 0          Micrometastasis: (>0.2 mm and < 2.0 mm): 0          Isolated Tumor Cells (<0.2 mm): 0          Laterality of Lymph Node with Tumor: NA          Extracapsular Extension: NA      Para-aortic Lymph Nodes Examined: 0          [0] Sentinel          [0] Non-sentinel          [0] Total      Para-aortic Lymph Nodes with Metastasis: NA          Macrometastasis: (>2.0 mm): NA          Micrometastasis:  (>0.2 mm  and < 2.0 mm): NA          Isolated Tumor Cells (<0.2 mm): NA          Laterality of Lymph Node with Tumor: NA          Extracapsular Extension: NA Distant Metastasis:      Distant Site(s) Involved: Not applicable Pathologic Stage Classification (pTNM, AJCC 8th Edition): pT1a, pN0 FIGO Stage (2023 staging for cancer of the endometrium): IIC Ancillary Studies: MMR / MSI testing will be ordered Representative Tumor Block: B4-B10 Comment(s): None   IHC EXPRESSION RESULTS   TEST           RESULT  MLH1:          Preserved nuclear expression  MSH2:          Preserved nuclear expression  MSH6:          Preserved nuclear expression  PMS2:          Preserved nuclear expression      Interval History: Presents with daughter today. Overall doing well. No new vaginal bleeding, abdominal/pelvic pain, unintentional weight loss, change in bowel or bladder habits, early satiety, bloating, nausea/vomiting.      Past Medical/Surgical History: Past Medical History:  Diagnosis Date   Cancer (HCC)    GERD (gastroesophageal reflux disease)    History of hiatal hernia    Hyperlipidemia    IBS (irritable bowel syndrome)     Past Surgical History:  Procedure Laterality Date   BREAST BIOPSY Right    x2   CESAREAN SECTION     1978   CESAREAN SECTION     1980   CESAREAN SECTION     1984   COLONOSCOPY     2014, 06/2022   LYMPH NODE DISSECTION N/A 01/22/2023   Procedure: RIGHT PELVIC LYMPHADENECTOMY;  Surgeon: Eldonna Mays, MD;  Location: [facility];   Service: Gynecology;  Laterality: N/A;   SENTINEL NODE BIOPSY N/A 01/22/2023   Procedure: LEFT SENTINEL NODE BIOPSY;  Surgeon: Eldonna Mays, MD;  Location: [facility];  Service: Gynecology;  Laterality: N/A;    Family History  Problem Relation Age of Onset   Leukemia Brother    Endometrial cancer Niece    Breast cancer Neg Hx    Colon cancer Neg Hx    Ovarian cancer Neg Hx    Pancreatic cancer Neg Hx    Prostate cancer Neg Hx     Social History   Socioeconomic History   Marital status: Married    Spouse name: Not on file   Number of children: Not on file   Years of education: Not on file   Highest education level: Not on file  Occupational History   Not on file  Tobacco Use   Smoking status: Never    Passive exposure: Never   Smokeless tobacco: Never  Vaping Use   Vaping status: Never Used  Substance and Sexual Activity   Alcohol use: Not Currently   Drug use: Never   Sexual activity: Not Currently  Other Topics Concern   Not on file  Social History Narrative   Not on file   Social Drivers of Health   Financial Resource Strain: Not on file  Food Insecurity: Not on file  Transportation Needs: Not on file  Physical Activity: Not on file  Stress: Not on file  Social Connections: Not on file    Current Medications:  Current Outpatient Medications:    Ascorbic Acid (VITAMIN C PO), Take 1  tablet by mouth daily., Disp: , Rfl:    Cholecalciferol 75 MCG (3000 UT) TABS, Take 3,000 Units by mouth daily., Disp: , Rfl:    cyanocobalamin (VITAMIN B12) 1000 MCG tablet, Take 1,000 mcg by mouth daily., Disp: , Rfl:    dicyclomine (BENTYL) 20 MG tablet, Take 20 mg by mouth every 6 (six) hours. Per patient take as needed for pain, Disp: , Rfl:    Melatonin 5 MG CHEW, as directed Orally, Disp: , Rfl:   Review of Symptoms: Complete 10-system review is positive for: none  Physical Exam: BP 126/66 (BP Location: Left Arm, Patient Position: Sitting)   Pulse 61   Temp 98.9 F  (37.2 C) (Oral)   Resp 20   Wt 137 lb (62.1 kg)   SpO2 98%   BMI 25.06 kg/m  General: Alert, oriented, no acute distress. HEENT: Normocephalic, atraumatic. Neck symmetric without masses. Sclera anicteric.  Chest: Normal work of breathing. Clear to auscultation bilaterally.   Cardiovascular: Regular rate and rhythm, no murmurs. Abdomen: Soft, nontender.  Normoactive bowel sounds.  No masses appreciated.  Well-healed incisions. Extremities: Grossly normal range of motion.  Warm, well perfused.  No edema bilaterally. Skin: No rashes or lesions noted. Lymphatics: No cervical, supraclavicular, or inguinal adenopathy. GU: Normal appearing external genitalia without erythema, excoriation, or lesions.  Speculum exam reveals normal vaginal cuff without lesion.  Bimanual exam reveals smooth cuff, no nodularity or pelvic mass.  Exam chaperoned by Kimberly [place], CMA    Laboratory & Radiologic Studies: none

## 2024-01-28 ENCOUNTER — Telehealth: Payer: Self-pay | Admitting: *Deleted

## 2024-01-28 NOTE — Telephone Encounter (Signed)
 Spoke with the patient and gave the date/time and instructions for the CT scan on 10/8 at 11 am at [facility]. Patient aware to pick up contrast before the appt date

## 2024-01-28 NOTE — Telephone Encounter (Signed)
 Hadassah Puma, pt's daughter returning call regarding her mom's CT scan appointment.  She is aware of CT scheduled on 10/8 at [facility]. She voiced an understanding and was thankful for the call

## 2024-02-26 ENCOUNTER — Ambulatory Visit
Admission: RE | Admit: 2024-02-26 | Discharge: 2024-02-26 | Disposition: A | Discharge status: Home / Self Care | Source: Ambulatory Visit | Attending: Psychiatry | Admitting: Psychiatry

## 2024-02-26 DIAGNOSIS — C541 Malignant neoplasm of endometrium: Secondary | ICD-10-CM

## 2024-02-26 DIAGNOSIS — K8020 Calculus of gallbladder without cholecystitis without obstruction: Secondary | ICD-10-CM | POA: Diagnosis not present

## 2024-02-26 MED ORDER — IOPAMIDOL (ISOVUE-300) INJECTION 61%
100.0000 mL | Freq: Once | INTRAVENOUS | Status: AC | PRN
  Administered 2024-02-26: 100 mL via INTRAVENOUS

## 2024-03-03 ENCOUNTER — Ambulatory Visit: Payer: Self-pay | Admitting: Psychiatry

## 2024-03-03 NOTE — Telephone Encounter (Signed)
 Per Dr.Newton, via [employer] ID# [phone], I reached out to [name], Pt's daughter Carrie Newman answered (she is on contact list) she is aware of recent CT scan result as reported by Dr.Newton. Carrie Newman states her mom is aware of the diverculitis and had a recent colonoscopy in February 2025. Carrie Newman will ask her mom specifics on the colonoscopy and will get that office to fax a copy for MD review.  She was thankful for the call

## 2024-04-13 DIAGNOSIS — D3132 Benign neoplasm of left choroid: Secondary | ICD-10-CM | POA: Diagnosis not present

## 2024-04-13 DIAGNOSIS — H2513 Age-related nuclear cataract, bilateral: Secondary | ICD-10-CM | POA: Diagnosis not present

## 2024-05-28 ENCOUNTER — Other Ambulatory Visit

## 2024-06-08 ENCOUNTER — Encounter: Payer: Self-pay | Admitting: Psychiatry

## 2024-06-08 ENCOUNTER — Inpatient Hospital Stay: Payer: Medicare (Managed Care) | Attending: Psychiatry | Admitting: Psychiatry

## 2024-06-08 VITALS — BP 130/84 | HR 64 | Temp 97.6°F | Resp 18 | Wt 134.0 lb

## 2024-06-08 DIAGNOSIS — Z90710 Acquired absence of both cervix and uterus: Secondary | ICD-10-CM | POA: Insufficient documentation

## 2024-06-08 DIAGNOSIS — Z8542 Personal history of malignant neoplasm of other parts of uterus: Secondary | ICD-10-CM | POA: Insufficient documentation

## 2024-06-08 DIAGNOSIS — Z9079 Acquired absence of other genital organ(s): Secondary | ICD-10-CM | POA: Insufficient documentation

## 2024-06-08 DIAGNOSIS — K5792 Diverticulitis of intestine, part unspecified, without perforation or abscess without bleeding: Secondary | ICD-10-CM

## 2024-06-08 DIAGNOSIS — Z90722 Acquired absence of ovaries, bilateral: Secondary | ICD-10-CM | POA: Diagnosis not present

## 2024-06-08 DIAGNOSIS — Z9221 Personal history of antineoplastic chemotherapy: Secondary | ICD-10-CM | POA: Diagnosis not present

## 2024-06-08 DIAGNOSIS — Z923 Personal history of irradiation: Secondary | ICD-10-CM | POA: Diagnosis not present

## 2024-06-08 DIAGNOSIS — C541 Malignant neoplasm of endometrium: Secondary | ICD-10-CM

## 2024-06-08 NOTE — Progress Notes (Signed)
 Gynecologic Oncology Return Clinic Visit  Date of Service: 06/08/2024 Referring Provider: Carter Quarry, MD   Assessment & Plan: Carrie Newman is a 74 y.o. 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 Alternative, adjuvant treatment with EBRT. - Pt declines adjuvant treatment.  - NED on exam today  - Signs/symptoms of recurrence reviewed. - Recommend follow-up q55mo. Pt wished to space out visits, so continue q50mo for the year.  - Previously discussed surveillance with CT given high risk histology and declined adjuvant treatment. CT 09/11/23 NED apart from mild diverticulitis. 33mo repeat on 02/26/24 NED with thickening of colonic wall, possible chronic diverticulitis. - Plan next CT ~April 2026. Ordered.  ?Chronic diverticulitis: - Pt reports she has been seen by [facility] and may have had a colonoscopy in October or November.  - We will try to obtain these results    RTC 57mo  Hoy Masters, MD Gynecologic Oncology  Medical Decision Making I personally spent  TOTAL 20 minutes face-to-face and non-face-to-face in the care of this patient, which includes all pre, intra, and post visit time on the date of service.  ----------------------- Reason for Visit: Surveillance  Treatment History: Oncology History  Endometrial cancer (HCC)  12/01/2022 Imaging   CT abdomen/pelvis: IMPRESSION: No CT findings to account for the patient's gross hematuria.   3.5 cm fundal lesion, possibly reflecting a noncalcified fibroid versus endometrial thickening. Pelvic ultrasound is suggested for further evaluation.   Cholelithiasis, without associated inflammatory changes.   12/14/2022 Imaging   Pelvic  ultrasound: IMPRESSION: Marked abnormal thickening of the endometrium measuring up to 32 mm highly suspicious for malignancy. Endometrial thickness is considered abnormal for an asymptomatic post-menopausal female. Endometrial sampling should be considered to exclude carcinoma.   12/31/2022 Initial Biopsy   Endometrial biopsy: Clear-cell carcinoma COMMENT:  The carcinoma shows patchy positivity with Napsin A and is negative with  P504S, estrogen receptor and progesterone receptor.  Immunohistochemistry for p53 shows wild-type (non mutated) pattern.  The  findings are consistent with endometrial clear cell carcinoma.    12/31/2022 Initial Diagnosis   Endometrial cancer (HCC)   01/08/2023 Imaging   CT Chest:  IMPRESSION: 1. No evidence for metastatic disease in the chest. 2. 2.1 cm left thyroid  nodule. Recommend thyroid  US  (ref: J Am Coll Radiol. 2015 Feb;12(2): 143-50). 3. 1.8 cm benign right adrenal adenoma. No follow-up imaging recommended. 4. Cholelithiasis. 5.  Aortic Atherosclerosis (ICD10-I70.0).   01/22/2023 Cancer Staging   Staging form: Corpus Uteri - Carcinoma and Carcinosarcoma, AJCC 8th Edition - Clinical stage from 01/22/2023: FIGO Stage IA (cT1a, cN0, cM0) - Signed by Masters Hoy, MD on 02/07/2023 Histopathologic type: Clear cell adenocarcinoma, NOS Stage prefix: Initial diagnosis Histologic grade (G): G3 Histologic grading system: 3 grade system Lymph-vascular invasion (LVI): LVI not present (absent)/not identified   01/22/2023 Surgery   Robotic-assisted total laparoscopic hysterectomy, bilateral salpingo-oophorectomy, bilateral sentinel lymph node evaluation, left sentinel lymph node biopsy, right pelvic and common iliac lymphadenectomy, omental biopsy    01/22/2023 Pathology Results   FINAL MICROSCOPIC DIAGNOSIS:  A. SENTINEL LYMPH NODE, LEFT EXTERNAL ILIAC, EXCISION:      One lymph node, negative for metastatic carcinoma (0/1).  B. UTERUS, CERVIX, BILATERAL  FALLOPIAN TUBES AND OVARIES:       Endometrium:  Clear cell adenocarcinoma, NOS.      Carcinoma invades 25% of the full thickness of myometrium (2 mm / 8 mm).      Lymphovascular invasion is not identified.      Pathologic Stage Classification (pTNM, AJCC 8th Edition): pT1a, pN0 FIGO Stage (2023 staging for cancer of the endometrium): IIC      See oncology table.       Other benign findings:  Cervix:           No ectocervical squamous epithelium identified.           Negative for malignancy. Endocervix:           Nabothian cyst.           Negative for hyperplasia, atypia or malignancy.      Endometrium:           Inactive endometrium.      Myometrium:           No other findings.      Serosa:           Unremarkable.           Negative for malignancy.      Bilateral fallopian tubes:           Benign fimbriated fallopian tubes with paratubal cysts.           Negative for malignancy.      Bilateral ovaries:           Benign ovarian parenchyma with inclusion cysts.           Negative for malignancy.  C. LYMPH NODE, RIGHT PELVIC, EXCISION:      Eight lymph nodes, negative for metastatic carcinoma (0/8).  D. LYMPH NODE, RIGHT COMMON ILIAC, EXCISION:      One lymph node, negative for metastatic carcinoma (0/1).  E. OMENTAL BIOPSY:      Benign mature adipose tissue.      Negative for malignancy.   ONCOLOGY TABLE:  UTERUS, CARCINOMA OR CARCINOSARCOMA: Resection  Procedure: Total hysterectomy and bilateral salpingo-oophorectomy Histologic Type: Clear cell adenocarcinoma, NOS Histologic Grade:  High-grade Myometrial Invasion:      Depth of Myometrial Invasion (mm): 2      Myometrial Thickness (mm): 8      Percentage of Myometrial Invasion: 25% Uterine Serosa Involvement: Not identified Cervical stromal Involvement: Not identified Extent of involvement of other tissue/organs: Not identified Peritoneal/Ascitic Fluid: Not involved Lymphovascular Invasion: Not  identified Regional Lymph Nodes:      Pelvic Lymph Nodes Examined: 10          [1] Sentinel          [9] Non-sentinel          [10] Total      Pelvic Lymph Nodes with Metastasis: 0          Macrometastasis: (>2.0 mm): 0          Micrometastasis: (>0.2 mm and < 2.0 mm): 0          Isolated Tumor Cells (<0.2 mm): 0          Laterality of Lymph Node with Tumor: NA          Extracapsular Extension: NA      Para-aortic Lymph Nodes Examined: 0          [0] Sentinel          [0] Non-sentinel          [0] Total  Para-aortic Lymph Nodes with Metastasis: NA          Macrometastasis: (>2.0 mm): NA          Micrometastasis:  (>0.2 mm and < 2.0 mm): NA          Isolated Tumor Cells (<0.2 mm): NA          Laterality of Lymph Node with Tumor: NA          Extracapsular Extension: NA Distant Metastasis:      Distant Site(s) Involved: Not applicable Pathologic Stage Classification (pTNM, AJCC 8th Edition): pT1a, pN0 FIGO Stage (2023 staging for cancer of the endometrium): IIC Ancillary Studies: MMR / MSI testing will be ordered Representative Tumor Block: B4-B10 Comment(s): None   IHC EXPRESSION RESULTS   TEST           RESULT  MLH1:          Preserved nuclear expression  MSH2:          Preserved nuclear expression  MSH6:          Preserved nuclear expression  PMS2:          Preserved nuclear expression      Interval History: Presents with her daughter today.  Patient thinks she may have had a colonoscopy with [facility] in October or November.  Patient's daughter reports that she had a diverticulitis flare in November but it passed and she is doing better now. No new vaginal bleeding, abdominal/pelvic pain, unintentional weight loss, change in bowel or bladder habits, early satiety, bloating, nausea/vomiting.      Past Medical/Surgical History: Past Medical History:  Diagnosis Date   Cancer (HCC)    GERD (gastroesophageal reflux disease)    History of hiatal hernia    Hyperlipidemia     IBS (irritable bowel syndrome)     Past Surgical History:  Procedure Laterality Date   BREAST BIOPSY Right    x2   CESAREAN SECTION     1978   CESAREAN SECTION     1980   CESAREAN SECTION     1984   COLONOSCOPY     2014, 06/2022   LYMPH NODE DISSECTION N/A 01/22/2023   Procedure: RIGHT PELVIC LYMPHADENECTOMY;  Surgeon: Eldonna Mays, MD;  Location: [facility];  Service: Gynecology;  Laterality: N/A;   SENTINEL NODE BIOPSY N/A 01/22/2023   Procedure: LEFT SENTINEL NODE BIOPSY;  Surgeon: Eldonna Mays, MD;  Location: [facility];  Service: Gynecology;  Laterality: N/A;    Family History  Problem Relation Age of Onset   Leukemia Brother    Endometrial cancer Niece    Breast cancer Neg Hx    Colon cancer Neg Hx    Ovarian cancer Neg Hx    Pancreatic cancer Neg Hx    Prostate cancer Neg Hx     Social History   Socioeconomic History   Marital status: Married    Spouse name: Not on file   Number of children: Not on file   Years of education: Not on file   Highest education level: Not on file  Occupational History   Not on file  Tobacco Use   Smoking status: Never    Passive exposure: Never   Smokeless tobacco: Never  Vaping Use   Vaping status: Never Used  Substance and Sexual Activity   Alcohol use: Not Currently   Drug use: Never   Sexual activity: Not Currently  Other Topics Concern   Not on file  Social History Narrative  Not on file   Social Drivers of Health   Tobacco Use: Low Risk (01/27/2024)   Patient History    Smoking Tobacco Use: Never    Smokeless Tobacco Use: Never    Passive Exposure: Never  Financial Resource Strain: Not on file  Food Insecurity: Not on file  Transportation Needs: Not on file  Physical Activity: Not on file  Stress: Not on file  Social Connections: Not on file  Depression (PHQ2-9): Low Risk (12/31/2022)   Depression (PHQ2-9)    PHQ-2 Score: 1  Alcohol Screen: Not on file  Housing: Unknown (06/17/2023)   Received from  [facility]   Epic    Unable to Pay for Housing in the Last Year: Not on file    Number of Times Moved in the Last Year: Not on file    At any time in the past 12 months, were you homeless or living in a shelter (including now)?: No  Utilities: Not on file  Health Literacy: Not on file    Current Medications:  Current Outpatient Medications:    Ascorbic Acid (VITAMIN C PO), Take 1 tablet by mouth daily., Disp: , Rfl:    Cholecalciferol 75 MCG (3000 UT) TABS, Take 3,000 Units by mouth daily., Disp: , Rfl:    cyanocobalamin (VITAMIN B12) 1000 MCG tablet, Take 1,000 mcg by mouth daily., Disp: , Rfl:    dicyclomine (BENTYL) 20 MG tablet, Take 20 mg by mouth every 6 (six) hours. Per patient take as needed for pain, Disp: , Rfl:    Melatonin 5 MG CHEW, as directed Orally, Disp: , Rfl:   Review of Symptoms: Complete 10-system review is positive for: none  Physical Exam: BP 130/84 (BP Location: Left Arm, Patient Position: Sitting)   Pulse 64   Temp 97.6 F (36.4 C) (Oral)   Resp 18   Wt 134 lb (60.8 kg)   SpO2 99%   BMI 24.51 kg/m  General: Alert, oriented, no acute distress. HEENT: Normocephalic, atraumatic. Neck symmetric without masses. Sclera anicteric.  Chest: Normal work of breathing. Clear to auscultation bilaterally.   Cardiovascular: Regular rate and rhythm, no murmurs. Abdomen: Soft, nontender.  Normoactive bowel sounds.  No masses appreciated.  Well-healed incisions. Extremities: Grossly normal range of motion.  Warm, well perfused.  No edema bilaterally. Skin: No rashes or lesions noted. Lymphatics: No cervical, supraclavicular, or inguinal adenopathy. GU: Normal appearing external genitalia without erythema, excoriation, or lesions.  Speculum exam reveals normal vaginal cuff without lesion.  Bimanual exam reveals smooth cuff, no nodularity or pelvic mass.  Exam chaperoned by Kimberly Jordan, CMA    Laboratory & Radiologic Studies: CT ABDOMEN PELVIS W  CONTRAST 02/26/2024  Narrative CLINICAL DATA:  History of genital cancer, monitor. * Tracking Code: BO *  EXAM: CT ABDOMEN AND PELVIS WITH CONTRAST  TECHNIQUE: Multidetector CT imaging of the abdomen and pelvis was performed using the standard protocol following bolus administration of intravenous contrast.  RADIATION DOSE REDUCTION: This exam was performed according to the departmental dose-optimization program which includes automated exposure control, adjustment of the mA and/or kV according to patient size and/or use of iterative reconstruction technique.  CONTRAST:  100mL ISOVUE -300 IOPAMIDOL  (ISOVUE -300) INJECTION 61%  COMPARISON:  Multiple priors including CT September 11, 2023.  FINDINGS: Lower chest: No acute abnormality.  Hepatobiliary: Subtle hypodensity in the dome of the liver on image 8/5 is technically too small to accurately characterize but stable from prior. No new suspicious hepatic lesion. Cholelithiasis. No biliary ductal  dilation.  Pancreas: No pancreatic ductal dilation or evidence of acute inflammation.  Spleen: No splenomegaly.  Adrenals/Urinary Tract: Stable right adrenal nodule measuring 16 mm anti parallel to the gland on image 16/5 this measures Hounsfield units of 3 on noncontrast enhanced portion of CT November 26, 2022 compatible with a benign adenoma. Left adrenal gland appears normal. No hydronephrosis. Kidneys demonstrate symmetric enhancement. Urinary bladder is unremarkable for degree of distension.  Stomach/Bowel: Stomach is minimally distended limiting evaluation. Tiny hiatal hernia. No pathologic dilation of small or large bowel. Radiopaque enteric contrast material traverses the descending colon. Left-sided colonic diverticulosis with wall thickening of a moderate length segment sigmoid and descending colonic wall. No significant adjacent stranding/fluid.  Vascular/Lymphatic: Normal caliber abdominal aorta. Smooth IVC contours. The  portal, splenic and superior mesenteric veins are patent. No pathologically enlarged abdominal or pelvic lymph nodes.  Reproductive: Uterus is surgically absent. No new suspicious nodularity along the vaginal cuff.  Other: No significant abdominopelvic free fluid. Sequela of subcutaneous injections in the posterior gluteal soft tissues.  Musculoskeletal: No aggressive lytic or blastic lesion of bone. Diffuse demineralization of bone. Thoracolumbar spondylosis.  IMPRESSION: 1. No evidence of recurrent or metastatic disease in the abdomen or pelvis. 2. Left-sided colonic diverticulosis with wall thickening of a moderate length segment sigmoid and descending colonic wall. No significant adjacent stranding/fluid. Findings are favored to reflect sequela of chronic diverticulitis. Consider correlation with colonoscopy to exclude underlying mass lesion. 3. Cholelithiasis. 4. Stable right adrenal adenoma.   Electronically Signed By: Reyes Holder M.D. On: 02/29/2024 12:45

## 2024-06-08 NOTE — Patient Instructions (Signed)
 It was a pleasure to see you in clinic today. - Normal exam today - CT scan in April - Return visit planned for 4 months  Thank you very much for allowing me to provide care for you today.  I appreciate your confidence in choosing our Gynecologic Oncology team at [facility].  If you have any questions about your visit today please call our office or send us  a MyChart message and we will get back to you as soon as possible.

## 2024-06-09 ENCOUNTER — Telehealth: Payer: Self-pay | Admitting: *Deleted

## 2024-06-09 NOTE — Telephone Encounter (Signed)
 Scheduled the patient for a CT scan on 4/2 at 10 am at [facility]. Also rescheduled follow up to 5/4. Spoke with the patient's daughter and gave appt dates/times/instructions

## 2024-08-20 ENCOUNTER — Other Ambulatory Visit

## 2024-09-21 ENCOUNTER — Inpatient Hospital Stay: Admitting: Psychiatry

## 2024-10-05 ENCOUNTER — Inpatient Hospital Stay: Admitting: Psychiatry
